# Patient Record
Sex: Female | Born: 1975 | Race: White | Hispanic: No | Marital: Single | Smoking: Former smoker
Health system: Southern US, Community
[De-identification: ages and names within clinical notes are randomized; demographics above are authoritative.]

## PROBLEM LIST (undated history)

## (undated) DIAGNOSIS — R519 Headache, unspecified: Secondary | ICD-10-CM

## (undated) DIAGNOSIS — F4310 Post-traumatic stress disorder, unspecified: Secondary | ICD-10-CM

## (undated) DIAGNOSIS — E785 Hyperlipidemia, unspecified: Secondary | ICD-10-CM

## (undated) DIAGNOSIS — I10 Essential (primary) hypertension: Secondary | ICD-10-CM

## (undated) DIAGNOSIS — F329 Major depressive disorder, single episode, unspecified: Secondary | ICD-10-CM

## (undated) DIAGNOSIS — F419 Anxiety disorder, unspecified: Secondary | ICD-10-CM

## (undated) DIAGNOSIS — G8929 Other chronic pain: Secondary | ICD-10-CM

## (undated) DIAGNOSIS — F32A Depression, unspecified: Secondary | ICD-10-CM

## (undated) HISTORY — DX: Other chronic pain: G89.29

## (undated) HISTORY — DX: Post-traumatic stress disorder, unspecified: F43.10

## (undated) HISTORY — DX: Essential (primary) hypertension: I10

## (undated) HISTORY — DX: Major depressive disorder, single episode, unspecified: F32.9

## (undated) HISTORY — DX: Headache, unspecified: R51.9

## (undated) HISTORY — DX: Depression, unspecified: F32.A

## (undated) HISTORY — DX: Hyperlipidemia, unspecified: E78.5

## (undated) HISTORY — DX: Anxiety disorder, unspecified: F41.9

---

## 2005-05-06 DIAGNOSIS — F4310 Post-traumatic stress disorder, unspecified: Secondary | ICD-10-CM

## 2005-05-06 HISTORY — DX: Post-traumatic stress disorder, unspecified: F43.10

## 2017-02-04 DIAGNOSIS — L259 Unspecified contact dermatitis, unspecified cause: Secondary | ICD-10-CM | POA: Insufficient documentation

## 2017-02-04 DIAGNOSIS — J0100 Acute maxillary sinusitis, unspecified: Secondary | ICD-10-CM | POA: Insufficient documentation

## 2017-08-29 ENCOUNTER — Encounter: Payer: Self-pay | Admitting: Family Medicine

## 2017-08-29 ENCOUNTER — Other Ambulatory Visit: Payer: Self-pay

## 2017-08-29 ENCOUNTER — Ambulatory Visit: Payer: 59 | Admitting: Family Medicine

## 2017-08-29 VITALS — BP 170/100 | HR 104 | Temp 98.9°F | Ht 66.5 in | Wt 240.4 lb

## 2017-08-29 DIAGNOSIS — Z6838 Body mass index (BMI) 38.0-38.9, adult: Secondary | ICD-10-CM

## 2017-08-29 DIAGNOSIS — F4310 Post-traumatic stress disorder, unspecified: Secondary | ICD-10-CM | POA: Diagnosis not present

## 2017-08-29 DIAGNOSIS — I10 Essential (primary) hypertension: Secondary | ICD-10-CM

## 2017-08-29 DIAGNOSIS — J302 Other seasonal allergic rhinitis: Secondary | ICD-10-CM | POA: Diagnosis not present

## 2017-08-29 DIAGNOSIS — F3341 Major depressive disorder, recurrent, in partial remission: Secondary | ICD-10-CM | POA: Diagnosis not present

## 2017-08-29 MED ORDER — DULOXETINE HCL 30 MG PO CPEP: 30.0000 mg | ORAL_CAPSULE | Freq: Every day | ORAL | 5 refills | Status: DC

## 2017-08-29 MED ORDER — LISINOPRIL-HYDROCHLOROTHIAZIDE 20-25 MG PO TABS: 1.0000 | ORAL_TABLET | Freq: Every day | ORAL | 5 refills | Status: DC
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
## 2017-08-29 NOTE — Patient Instructions (Signed)
     IF you received an x-ray today, you will receive an invoice from [facility]. Please contact [facility] at [phone] with questions or concerns regarding your invoice.   IF you received labwork today, you will receive an invoice from [facility]. Please contact [facility] at [phone] with questions or concerns regarding your invoice.   Our billing staff will not be able to assist you with questions regarding bills from these companies.  You will be contacted with the lab results as soon as they are available. The fastest way to get your results is to activate your My Chart account. Instructions are located on the last page of this paperwork. If you have not heard from us regarding the results in 2 weeks, please contact this office.     

## 2017-08-30 LAB — CBC WITH DIFFERENTIAL/PLATELET
Basophils Absolute: 0.1 10*3/uL (ref 0.0–0.2)
Basos: 1 %
EOS (ABSOLUTE): 0.2 10*3/uL (ref 0.0–0.4)
Eos: 2 %
Hematocrit: 39 % (ref 34.0–46.6)
Hemoglobin: 12.7 g/dL (ref 11.1–15.9)
Immature Grans (Abs): 0 10*3/uL (ref 0.0–0.1)
Immature Granulocytes: 0 %
Lymphocytes Absolute: 4.1 10*3/uL — ABNORMAL HIGH (ref 0.7–3.1)
Lymphs: 40 %
MCH: 26.9 pg (ref 26.6–33.0)
MCHC: 32.6 g/dL (ref 31.5–35.7)
MCV: 83 fL (ref 79–97)
Monocytes Absolute: 0.5 10*3/uL (ref 0.1–0.9)
Monocytes: 5 %
Neutrophils Absolute: 5.5 10*3/uL (ref 1.4–7.0)
Neutrophils: 52 %
Platelets: 354 10*3/uL (ref 150–379)
RBC: 4.72 x10E6/uL (ref 3.77–5.28)
RDW: 15.1 % (ref 12.3–15.4)
WBC: 10.3 10*3/uL (ref 3.4–10.8)

## 2017-08-30 LAB — CMP14+EGFR
ALT: 14 IU/L (ref 0–32)
AST: 15 IU/L (ref 0–40)
Albumin/Globulin Ratio: 1.8 (ref 1.2–2.2)
Albumin: 4.6 g/dL (ref 3.5–5.5)
Alkaline Phosphatase: 75 IU/L (ref 39–117)
BUN/Creatinine Ratio: 15 (ref 9–23)
BUN: 13 mg/dL (ref 6–24)
Bilirubin Total: <0.2 mg/dL (ref 0.0–1.2)
CO2: 20 mmol/L (ref 20–29)
Calcium: 9.5 mg/dL (ref 8.7–10.2)
Chloride: 101 mmol/L (ref 96–106)
Creatinine, Ser: 0.89 mg/dL (ref 0.57–1.00)
GFR calc Af Amer: 93 mL/min/{1.73_m2} (ref >59)
GFR calc non Af Amer: 81 mL/min/{1.73_m2} (ref >59)
Globulin, Total: 2.5 g/dL (ref 1.5–4.5)
Glucose: 81 mg/dL (ref 65–99)
Potassium: 4.2 mmol/L (ref 3.5–5.2)
Sodium: 138 mmol/L (ref 134–144)
Total Protein: 7.1 g/dL (ref 6.0–8.5)

## 2017-08-30 LAB — TSH: TSH: 2.29 u[IU]/mL (ref 0.450–4.500)

## 2017-08-30 LAB — LIPID PANEL
Chol/HDL Ratio: 5.2 ratio — ABNORMAL HIGH (ref 0.0–4.4)
Cholesterol, Total: 219 mg/dL — ABNORMAL HIGH (ref 100–199)
HDL: 42 mg/dL (ref >39)
LDL Calculated: 132 mg/dL — ABNORMAL HIGH (ref 0–99)
Triglycerides: 227 mg/dL — ABNORMAL HIGH (ref 0–149)
VLDL Cholesterol Cal: 45 mg/dL — ABNORMAL HIGH (ref 5–40)

## 2017-08-30 LAB — HEMOGLOBIN A1C
Est. average glucose Bld gHb Est-mCnc: 108 mg/dL
Hgb A1c MFr Bld: 5.4 % (ref 4.8–5.6)

## 2017-09-03 DIAGNOSIS — F4310 Post-traumatic stress disorder, unspecified: Secondary | ICD-10-CM | POA: Insufficient documentation

## 2017-09-03 DIAGNOSIS — J302 Other seasonal allergic rhinitis: Secondary | ICD-10-CM | POA: Insufficient documentation

## 2017-09-03 DIAGNOSIS — I10 Essential (primary) hypertension: Secondary | ICD-10-CM | POA: Insufficient documentation

## 2017-09-03 DIAGNOSIS — F418 Other specified anxiety disorders: Secondary | ICD-10-CM | POA: Insufficient documentation

## 2017-09-03 DIAGNOSIS — F3341 Major depressive disorder, recurrent, in partial remission: Secondary | ICD-10-CM | POA: Insufficient documentation

## 2017-09-03 MED ORDER — FLUTICASONE PROPIONATE 50 MCG/ACT NA SUSP: 1.0000 | Freq: Two times a day (BID) | NASAL | 6 refills | Status: DC

## 2017-09-25 ENCOUNTER — Ambulatory Visit (INDEPENDENT_AMBULATORY_CARE_PROVIDER_SITE_OTHER): Payer: 59 | Admitting: Family Medicine

## 2017-09-25 ENCOUNTER — Encounter: Payer: Self-pay | Admitting: Family Medicine

## 2017-09-25 ENCOUNTER — Other Ambulatory Visit: Payer: Self-pay

## 2017-09-25 VITALS — BP 118/82 | HR 93 | Temp 98.8°F | Ht 66.54 in | Wt 239.0 lb

## 2017-09-25 DIAGNOSIS — I10 Essential (primary) hypertension: Secondary | ICD-10-CM

## 2017-09-25 DIAGNOSIS — N920 Excessive and frequent menstruation with regular cycle: Secondary | ICD-10-CM

## 2017-09-25 DIAGNOSIS — Z01419 Encounter for gynecological examination (general) (routine) without abnormal findings: Secondary | ICD-10-CM | POA: Diagnosis not present

## 2017-09-25 DIAGNOSIS — Z23 Encounter for immunization: Secondary | ICD-10-CM

## 2017-09-25 NOTE — Progress Notes (Signed)
5/23/20198:27 AM  Maria Morse 13-Mar-1976, 42 y.o. female [id number]  Chief Complaint  Patient presents with  . Gynecologic Exam    HPI:   Patient is a 42 y.o. female with past medical history significant for PTSD and HTN who presents today for CPE  Cervical Cancer Screening: 2015, normal, denies h/o abnormal. Breast Cancer Screening: n/a, no fhx breast or ovarian cancer Colorectal Cancer Screening: n/a, no fhx colon cancer Bone Density Testing: n/a HIV Screening: 2015 STI Screening: declines Seasonal Influenza Vaccination: will need this season Td/Tdap Vaccination: ordered today Pneumococcal Vaccination: n/a Zoster Vaccination: n/a Frequency of Dental evaluation: needs to establish with new dentist in [place] Frequency of Eye evaluation: yearly, wears eyeglasses  Patient G1P0 Reports regular menses, LMP 09/05/17 Reports menses about 5-7 days, for past 2 years have been getting heavier more painful Sister h/o fibroids, mother h/o "pre-uterine cancer" Not on [facility], not currently sexually active Discussed labs done in April Tolerating new BP med well, denies any side effects  Fall Risk  09/25/2017 08/29/2017  Falls in the past year? No No     Depression screen PHQ 2/9 08/29/2017  Decreased Interest 1  Down, Depressed, Hopeless 1  PHQ - 2 Score 2  Altered sleeping 2  Tired, decreased energy 3  Change in appetite 3  Feeling bad or failure about yourself  2  Trouble concentrating 2  Moving slowly or fidgety/restless 0  Suicidal thoughts 0  PHQ-9 Score 14  Difficult doing work/chores Very difficult    No Known Allergies  Prior to Admission medications   Medication Sig Start Date End Date Taking? Authorizing Provider  cetirizine (ZYRTEC) 10 MG tablet Take 1 tablet by mouth daily.   Yes [provider]  DULoxetine (CYMBALTA) 30 MG capsule Take 1 capsule (30 mg total) by mouth daily. 08/29/17  Yes Myles Lipps, MD  fluticasone (FLONASE) 50 MCG/ACT nasal spray  Place 1 spray into both nostrils 2 (two) times daily. 09/03/17  Yes Myles Lipps, MD  lisinopril-hydrochlorothiazide (PRINZIDE,ZESTORETIC) 20-25 MG tablet Take 1 tablet by mouth daily. 08/29/17  Yes Myles Lipps, MD    Past Medical History:  Diagnosis Date  . Anxiety   . Depression   . Hypertension     History reviewed. No pertinent surgical history.  Social History   Tobacco Use  . Smoking status: Never Smoker  . Smokeless tobacco: Never Used  Substance Use Topics  . Alcohol use: Never    Frequency: Never    Family History  Problem Relation Age of Onset  . Healthy Mother   . Hypertension Father   . Hyperlipidemia Father   . Healthy Sister   . Healthy Brother     Review of Systems  Constitutional: Negative for chills and fever.  Respiratory: Negative for cough and shortness of breath.   Cardiovascular: Negative for chest pain, palpitations and leg swelling.  Gastrointestinal: Negative for abdominal pain, blood in stool, constipation, diarrhea, melena, nausea and vomiting.  Genitourinary: Negative for dysuria and hematuria.       Neg breast lumps or nipple discharge Neg vaginal discharge, pelvic pain  Neurological: Negative for dizziness and headaches.  All other systems reviewed and are negative.    OBJECTIVE:  Blood pressure 118/82, pulse 93, temperature 98.8 F (37.1 C), temperature source Oral, height 5' 6.54" (1.69 m), weight 239 lb (108.4 kg), SpO2 96 %.  Physical Exam  Constitutional: She is oriented to person, place, and time. She appears well-developed and well-nourished.  HENT:  Head: Normocephalic and atraumatic.  Right Ear: Hearing, tympanic membrane, external ear and ear canal normal.  Left Ear: Hearing, tympanic membrane, external ear and ear canal normal.  Mouth/Throat: Oropharynx is clear and moist.  Eyes: Pupils are equal, round, and reactive to light. Conjunctivae and EOM are normal.  Neck: Neck supple. No thyromegaly present.    Cardiovascular: Normal rate, regular rhythm, normal heart sounds and intact distal pulses. Exam reveals no gallop and no friction rub.  No murmur heard. Pulmonary/Chest: Effort normal and breath sounds normal. She has no wheezes. She has no rhonchi. She has no rales. Right breast exhibits no inverted nipple, no mass, no nipple discharge, no skin change and no tenderness. Left breast exhibits no inverted nipple, no mass, no nipple discharge, no skin change and no tenderness. Breasts are symmetrical.  Abdominal: Soft. Bowel sounds are normal. She exhibits no distension. There is no hepatosplenomegaly. There is no tenderness. There is no guarding.  Genitourinary: There is no rash or lesion on the right labia. There is no rash or lesion on the left labia. Uterus is not enlarged, not fixed and not tender. Cervix exhibits no motion tenderness, no discharge and no friability. Right adnexum displays no mass and no tenderness. Left adnexum displays no mass and no tenderness. No erythema in the vagina. No vaginal discharge found.  Musculoskeletal: Normal range of motion. She exhibits no edema.  Lymphadenopathy:    She has no cervical adenopathy.    She has no axillary adenopathy.       Right: No supraclavicular adenopathy present.       Left: No supraclavicular adenopathy present.  Neurological: She is alert and oriented to person, place, and time. She has normal reflexes.  Skin: Skin is warm and dry.  Psychiatric: She has a normal mood and affect.  Nursing note and vitals reviewed.   ASSESSMENT and PLAN  1. Encounter for annual routine gynecological examination  HCM reviewed/discussed. Anticipatory guidance regarding healthy weight, lifestyle and choices given.  - Pap IG w/ reflex to HPV when ASC-U  2. Essential hypertension, benign At goal, cont current meds, recheck BMP in 1 month - Basic Metabolic Panel; Future  3. Menorrhagia with regular cycle Discussed treatment options, [place] to r/o  structural causes, consider endomterial bx given age and nulliparous state. Referring to gyn for further evaluation and management. CBC and TSH in April were normal. - US PELVIC COMPLETE WITH TRANSVAGINAL; Future - Ambulatory referral to Gynecology  4. Need for vaccination - Tdap vaccine greater than or equal to 7yo IM  Return in about 6 months (around 03/28/2018).    Myles Lipps, MD Primary Care at [facility] [address] [place], [place] [address] Ph.  [phone] Fax [phone]

## 2017-09-25 NOTE — Patient Instructions (Addendum)
1. Please come in for labwork (lab visit only) in bout a month  For therapy -- [facility] ([address] [place]) - [phone] [place] [address], [place]) - [place] [phone] [facility] [place] - [phone] [place], [phone] [facility]  - [phone] (do not file insurance) Florentina Jenny [phone] [facility] - [place], [phone] [facility] [place] [facility]    IF you received an x-ray today, you will receive an invoice from [facility]. Please contact [facility] at [phone] with questions or concerns regarding your invoice.   IF you received labwork today, you will receive an invoice from [facility]. Please contact [facility] at [phone] with questions or concerns regarding your invoice.   Our billing staff will not be able to assist you with questions regarding bills from these companies.  You will be contacted with the lab results as soon as they are available. The fastest way to get your results is to activate your My Chart account. Instructions are located on the last page of this paperwork. If you have not heard from [place] regarding the results in 2 weeks, please contact this office.     Preventive Care 40-64 Years, Female Preventive care refers to lifestyle choices and visits with your health care provider that can promote health and wellness. What does preventive care include?  A yearly physical exam. This is also called an annual well check.  Dental exams once or twice a year.  Routine eye exams. Ask your health care provider how often you should have your eyes  checked.  Personal lifestyle choices, including: ? Daily care of your teeth and gums. ? Regular physical activity. ? Eating a healthy diet. ? Avoiding tobacco and drug use. ? Limiting alcohol use. ? Practicing safe sex. ? Taking low-dose aspirin daily starting at age 70. ? Taking vitamin and mineral supplements as recommended by your health care provider. What happens during an annual well check? The services and screenings done by your health care provider during your annual well check will depend on your age, overall health, lifestyle risk factors, and family history of disease. Counseling Your health care provider may ask you questions about your:  Alcohol use.  Tobacco use.  Drug use.  Emotional well-being.  Home and relationship well-being.  Sexual activity.  Eating habits.  Work and work Statistician.  Method of birth control.  Menstrual cycle.  Pregnancy history.  Screening You may have the following tests or measurements:  Height, weight, and BMI.  Blood pressure.  Lipid and cholesterol levels. These may be checked every 5 years, or more frequently if you are over 46 years old.  Skin check.  Lung cancer screening. You may have this screening every year starting at age 27 if you have a 30-pack-year history of smoking and currently smoke or have quit within the past 15 years.  Fecal occult blood test (FOBT) of the stool. You may have this test every year starting at age 42.  Flexible sigmoidoscopy or colonoscopy. You may have a sigmoidoscopy every 5 years or a colonoscopy every 10 years starting at age 2.  Hepatitis C blood test.  Hepatitis B blood test.  Sexually transmitted disease (STD) testing.  Diabetes screening. This is done by checking your blood sugar (glucose)  after you have not eaten for a while (fasting). You may have this done every 1-3 years.  Mammogram. This may be done every 1-2 years. Talk to your health care provider about when  you should start having regular mammograms. This may depend on whether you have a family history of breast cancer.  BRCA-related cancer screening. This may be done if you have a family history of breast, ovarian, tubal, or peritoneal cancers.  Pelvic exam and Pap test. This may be done every 3 years starting at age 51. Starting at age 7, this may be done every 5 years if you have a Pap test in combination with an HPV test.  Bone density scan. This is done to screen for osteoporosis. You may have this scan if you are at high risk for osteoporosis.  Discuss your test results, treatment options, and if necessary, the need for more tests with your health care provider. Vaccines Your health care provider may recommend certain vaccines, such as:  Influenza vaccine. This is recommended every year.  Tetanus, diphtheria, and acellular pertussis (Tdap, Td) vaccine. You may need a Td booster every 10 years.  Varicella vaccine. You may need this if you have not been vaccinated.  Zoster vaccine. You may need this after age 64.  Measles, mumps, and rubella (MMR) vaccine. You may need at least one dose of MMR if you were born in 1957 or later. You may also need a second dose.  Pneumococcal 13-valent conjugate (PCV13) vaccine. You may need this if you have certain conditions and were not previously vaccinated.  Pneumococcal polysaccharide (PPSV23) vaccine. You may need one or two doses if you smoke cigarettes or if you have certain conditions.  Meningococcal vaccine. You may need this if you have certain conditions.  Hepatitis A vaccine. You may need this if you have certain conditions or if you travel or work in places where you may be exposed to hepatitis A.  Hepatitis B vaccine. You may need this if you have certain conditions or if you travel or work in places where you may be exposed to hepatitis B.  Haemophilus influenzae type b (Hib) vaccine. You may need this if you have certain  conditions.  Talk to your health care provider about which screenings and vaccines you need and how often you need them. This information is not intended to replace advice given to you by your health care provider. Make sure you discuss any questions you have with your health care provider. Document Released: 05/19/2015 Document Revised: 01/10/2016 Document Reviewed: 02/21/2015 Elsevier Interactive Patient Education  [employer].

## 2017-10-01 LAB — PAP IG W/ RFLX HPV ASCU: PAP Smear Comment: 0

## 2017-10-02 ENCOUNTER — Encounter: Payer: Self-pay | Admitting: Family Medicine

## 2017-10-08 ENCOUNTER — Ambulatory Visit: Payer: 59 | Admitting: Gynecology

## 2017-10-08 ENCOUNTER — Encounter: Payer: Self-pay | Admitting: Gynecology

## 2017-10-08 VITALS — BP 132/88 | Ht 66.0 in | Wt 240.0 lb

## 2017-10-08 DIAGNOSIS — N924 Excessive bleeding in the premenopausal period: Secondary | ICD-10-CM

## 2017-10-08 DIAGNOSIS — N946 Dysmenorrhea, unspecified: Secondary | ICD-10-CM

## 2017-10-08 NOTE — Patient Instructions (Signed)
Follow up for ultrasound as scheduled 

## 2017-10-08 NOTE — Progress Notes (Signed)
    Elby BeckKathryn Janoski Nov 20, 1975 [id number]        42 y.o. G1 P0010 new patient who presents with a 3-year history of worsening menses.  Patient notes that her periods have progressively gotten heavier and more painful.  She now has to change tampons frequently having bleedthrough episodes and significant cramping and discomfort.  Periods are lasting 6 days occurring monthly with no intermenstrual bleeding.  Currently not sexually active.  Using no contraception.  Family history of leiomyoma and endometriosis.  Recent GYN exam by her primary physician was normal to include a negative Pap smear.  Recent hemoglobin and TSH normal  Past medical history,surgical history, problem list, medications, allergies, family history and social history were all reviewed and documented in the EPIC chart.  Directed ROS with pertinent positives and negatives documented in the history of present illness/assessment and plan.  Exam: Robin assistant Vitals:   10/08/17 0827  BP: 132/88  Weight: 240 lb (108.9 kg)  Height: 5\' 6"  (1.676 m)   General appearance:  Normal Abdomen soft nontender without masses guarding rebound Pelvic external BUS vagina normal.  Cervix normal.  Uterus normal size midline mobile nontender.  Adnexa without masses or tenderness.  Rectal exam is normal.  Assessment/Plan:  42 y.o. G1 P0010 with several year history of increasing dysmenorrhea and menorrhagia.  Does have a family history of leiomyoma and endometriosis.  Pelvic exam is normal.  Currently on no contraception.  Not sexually active.  We discussed the differential to include primary dysmenorrhea/menorrhagia, adenomyosis/endometriosis, leiomyoma/endometrial polyps all reviewed.  Possible treatment options ranging from hormonal manipulation such as oral contraceptives either intermittently or continuously or progesterone only products, Mirena IUD, hysteroscopy if endometrial defects and laparoscopy rule out endometriosis.  Recommended we  start with sonohysterogram for pelvic surveillance rule out cavitary defects, leiomyoma, ovarian pathology.  Patient will schedule in follow-up for this and then we will rediscuss treatment options.  Blood pressure 132/88 reviewed and she relates just starting on an antihypertensive.  Greater than 50% of my time was spent in direct face to face counseling and coordination of care with the patient.     Dara Lordsimothy P Pranavi Aure MD, 9:06 AM 10/08/2017

## 2017-10-13 ENCOUNTER — Other Ambulatory Visit: Payer: Self-pay | Admitting: Gynecology

## 2017-10-13 ENCOUNTER — Other Ambulatory Visit: Payer: Self-pay

## 2017-10-13 DIAGNOSIS — N939 Abnormal uterine and vaginal bleeding, unspecified: Secondary | ICD-10-CM

## 2017-10-23 ENCOUNTER — Other Ambulatory Visit: Payer: 59

## 2017-10-23 ENCOUNTER — Ambulatory Visit: Payer: 59 | Admitting: Gynecology

## 2018-03-06 ENCOUNTER — Other Ambulatory Visit: Payer: Self-pay | Admitting: Family Medicine

## 2018-03-06 NOTE — Telephone Encounter (Signed)
Requested Prescriptions  Pending Prescriptions Disp Refills  . lisinopril-hydrochlorothiazide (PRINZIDE,ZESTORETIC) 20-25 MG tablet [Pharmacy Med Name: LISINOPRIL-HCTZ 20-25 MG TAB] 90 tablet 0    Sig: TAKE ONE TABLET BY MOUTH DAILY     Cardiovascular:  ACEI + Diuretic Combos Failed - 03/06/2018  6:20 AM      Failed - Na in normal range and within 180 days    Sodium  Date Value Ref Range Status  08/29/2017 138 134 - 144 mmol/L Final         Failed - K in normal range and within 180 days    Potassium  Date Value Ref Range Status  08/29/2017 4.2 3.5 - 5.2 mmol/L Final         Failed - Cr in normal range and within 180 days    Creatinine, Ser  Date Value Ref Range Status  08/29/2017 0.89 0.57 - 1.00 mg/dL Final         Failed - Ca in normal range and within 180 days    Calcium  Date Value Ref Range Status  08/29/2017 9.5 8.7 - 10.2 mg/dL Final         Passed - Patient is not pregnant      Passed - Last BP in normal range    BP Readings from Last 1 Encounters:  10/08/17 132/88         Passed - Valid encounter within last 6 months    Recent Outpatient Visits          5 months ago Encounter for annual routine gynecological examination   [facility], Meda Coffee, MD   6 months ago Essential hypertension, benign   [facility], Meda Coffee, MD

## 2018-03-11 ENCOUNTER — Ambulatory Visit (INDEPENDENT_AMBULATORY_CARE_PROVIDER_SITE_OTHER): Payer: 59 | Admitting: Physician Assistant

## 2018-03-11 DIAGNOSIS — I10 Essential (primary) hypertension: Secondary | ICD-10-CM

## 2018-03-12 LAB — BASIC METABOLIC PANEL
BUN/Creatinine Ratio: 14 (ref 9–23)
BUN: 13 mg/dL (ref 6–24)
CO2: 23 mmol/L (ref 20–29)
Calcium: 9.7 mg/dL (ref 8.7–10.2)
Chloride: 99 mmol/L (ref 96–106)
Creatinine, Ser: 0.95 mg/dL (ref 0.57–1.00)
GFR calc Af Amer: 86 mL/min/{1.73_m2} (ref >59)
GFR calc non Af Amer: 75 mL/min/{1.73_m2} (ref >59)
Glucose: 84 mg/dL (ref 65–99)
Potassium: 4.1 mmol/L (ref 3.5–5.2)
Sodium: 138 mmol/L (ref 134–144)

## 2018-03-12 NOTE — Progress Notes (Signed)
FT only. I did not see this pt.

## 2018-03-15 ENCOUNTER — Other Ambulatory Visit: Payer: Self-pay | Admitting: Family Medicine

## 2018-03-24 ENCOUNTER — Ambulatory Visit: Payer: 59 | Admitting: Family Medicine

## 2018-03-24 ENCOUNTER — Encounter: Payer: Self-pay | Admitting: Family Medicine

## 2018-03-24 ENCOUNTER — Other Ambulatory Visit: Payer: Self-pay

## 2018-03-24 VITALS — BP 112/77 | HR 83 | Temp 97.7°F | Ht 66.0 in | Wt 236.8 lb

## 2018-03-24 DIAGNOSIS — Z23 Encounter for immunization: Secondary | ICD-10-CM

## 2018-03-24 DIAGNOSIS — F4310 Post-traumatic stress disorder, unspecified: Secondary | ICD-10-CM | POA: Diagnosis not present

## 2018-03-24 DIAGNOSIS — R05 Cough: Secondary | ICD-10-CM

## 2018-03-24 DIAGNOSIS — I10 Essential (primary) hypertension: Secondary | ICD-10-CM | POA: Diagnosis not present

## 2018-03-24 DIAGNOSIS — R059 Cough, unspecified: Secondary | ICD-10-CM

## 2018-03-24 MED ORDER — HYDROCHLOROTHIAZIDE 25 MG PO TABS: 25.0000 mg | ORAL_TABLET | Freq: Every day | ORAL | 1 refills | Status: DC

## 2018-03-24 MED ORDER — LOSARTAN POTASSIUM 50 MG PO TABS: 50.0000 mg | ORAL_TABLET | Freq: Every day | ORAL | 1 refills | Status: DC

## 2018-03-24 NOTE — Progress Notes (Signed)
11/19/20199:06 AM  Maria BeckKathryn Morse Sep 23, 1975, 42 y.o. female [id number]  Chief Complaint  Patient presents with  . Follow-up    6 month follow and discussion of lab results  . Medication Refill    duloxetine and lisinopril    HPI:   Patient is a 42 y.o. female with past medical history significant for PTSD and HTN  who presents today for routine followup  Overall tolerating BP meds She however has been having a daily cough since started Has been doing online nutrition program Eating more meals at home, drinking more water  Continues to have irritability and hypervigilence Less Intrusive thoughts and nightmares Still looking into counseling Tries to manage with meditation Of recent waking up more often in middle of the night Usually able to go back to sleep Uses weighted blanket Has goof support system  Saw gyn, wanted to do sonohysto Cant afford Menses getting liighter  Lab Results  Component Value Date   CREATININE 0.95 03/11/2018   Lab Results  Component Value Date   NA 138 03/11/2018   K 4.1 03/11/2018   CL 99 03/11/2018   CO2 23 03/11/2018    Fall Risk  03/24/2018 09/25/2017 08/29/2017  Falls in the past year? 0 No No     [facility] screen [facility] 2/9 03/24/2018 08/29/2017  Decreased Interest 0 1  Down, Depressed, Hopeless 0 1  PHQ - 2 Score 0 2  Altered sleeping 1 2  Tired, decreased energy 1 3  Change in appetite 1 3  Feeling bad or failure about yourself  0 2  Trouble concentrating 1 2  Moving slowly or fidgety/restless 1 0  Suicidal thoughts 0 0  PHQ-9 Score 5 14  Difficult doing work/chores Somewhat difficult Very difficult   GAD 7 : Generalized Anxiety Score 03/24/2018 08/29/2017  Nervous, Anxious, on Edge 1 2  Control/stop worrying 1 2  Worry too much - different things 1 3  Trouble relaxing 1 3  Restless 0 0  Easily annoyed or irritable 1 2  Afraid - awful might happen 1 2  Total GAD 7 Score 6 14  Anxiety Difficulty Somewhat difficult  Very difficult     No Known Allergies  Prior to Admission medications   Medication Sig Start Date End Date Taking? Authorizing Provider  cetirizine (ZYRTEC) 10 MG tablet Take 1 tablet by mouth daily.   Yes [provider]  DULoxetine (CYMBALTA) 30 MG capsule TAKE ONE CAPSULE BY MOUTH DAILY 03/16/18  Yes Maria LippsSantiago, Maria Morse M, MD  fluticasone [facility](FLONASE) 50 MCG/ACT nasal spray Place 1 spray into both nostrils 2 (two) times daily. 09/03/17  Yes Maria LippsSantiago, Maria Morse M, MD  lisinopril-hydrochlorothiazide (PRINZIDE,ZESTORETIC) 20-25 MG tablet TAKE ONE TABLET BY MOUTH DAILY 03/06/18  Yes Maria LippsSantiago, Wisdom Seybold M, MD    Past Medical History:  Diagnosis Date  . Anxiety   . Depression   . Hypertension   . PTSD (post-traumatic stress disorder) 2007    History reviewed. No pertinent surgical history.  Social History   Tobacco Use  . Smoking status: Never Smoker  . Smokeless tobacco: Never Used  Substance Use Topics  . Alcohol use: Never    Frequency: Never    Family History  Problem Relation Age of Onset  . Healthy Mother   . Hypertension Father   . Hyperlipidemia Father   . Healthy Sister   . Healthy Brother     Review of Systems  Constitutional: Negative for chills and fever.  HENT: Positive for congestion.   Respiratory:  Positive for cough. Negative for shortness of breath.   Cardiovascular: Negative for chest pain, palpitations and leg swelling.  Gastrointestinal: Negative for abdominal pain, nausea and vomiting.  Psychiatric/Behavioral: Negative for depression and suicidal ideas. The patient is nervous/anxious and has insomnia.      OBJECTIVE:  Blood pressure 112/77, pulse 83, temperature 97.7 F (36.5 C), temperature source Oral, height 5\' 6"  (1.676 m), weight 236 lb 12.8 oz (107.4 kg), last menstrual period 03/23/2018, SpO2 97 %. Body mass index is 38.22 kg/m.   Wt Readings from Last 3 Encounters:  03/24/18 236 lb 12.8 oz (107.4 kg)  10/08/17 240 lb (108.9 kg)  09/25/17  239 lb (108.4 kg)    Physical Exam  Constitutional: She is oriented to person, place, and time. She appears well-developed and well-nourished.  HENT:  Head: Normocephalic and atraumatic.  Mouth/Throat: Oropharynx is clear and moist. No oropharyngeal exudate.  Eyes: Pupils are equal, round, and reactive to light. Conjunctivae and EOM are normal. No scleral icterus.  Neck: Neck supple.  Cardiovascular: Normal rate, regular rhythm and normal heart sounds. Exam reveals no gallop and no friction rub.  No murmur heard. Pulmonary/Chest: Effort normal and breath sounds normal. She has no wheezes. She has no rales.  Musculoskeletal: She exhibits no edema.  Neurological: She is alert and oriented to person, place, and time.  Skin: Skin is warm and dry.  Psychiatric: She has a normal mood and affect.  Nursing note and vitals reviewed.   ASSESSMENT and PLAN  1. Essential hypertension, benign Controlled. However concern for ACE cough. Changing to ARB. Discussed home BP monitoring. Cont with LFM  2. PTSD (post-traumatic stress disorder) Controlled. Continue current regime.   3. Cough Change ace to arb and re-assess. Consider PND as cause  4. Need for prophylactic vaccination and inoculation against influenza - Flu Vaccine QUAD 36+ mos IM  Other orders - losartan (COZAAR) 50 MG tablet; Take 1 tablet (50 mg total) by mouth daily. - hydrochlorothiazide (HYDRODIURIL) 25 MG tablet; Take 1 tablet (25 mg total) by mouth daily.    Return in about 3 months (around 06/24/2018) for chronic medical conditions.    Maria Lipps, MD Primary Care at [facility] [address] [place], [place] [address] Ph.  [phone] Fax [phone]

## 2018-03-24 NOTE — Patient Instructions (Signed)
° ° ° °  If you have lab work done today you will be contacted with your lab results within the next 2 weeks.  If you have not heard from us then please contact us. The fastest way to get your results is to register for My Chart. ° ° °IF you received an x-ray today, you will receive an invoice from [facility]. Please contact [facility] at [phone] with questions or concerns regarding your invoice.  ° °IF you received labwork today, you will receive an invoice from [facility]. Please contact [facility] at [phone] with questions or concerns regarding your invoice.  ° °Our billing staff will not be able to assist you with questions regarding bills from these companies. ° °You will be contacted with the lab results as soon as they are available. The fastest way to get your results is to activate your My Chart account. Instructions are located on the last page of this paperwork. If you have not heard from us regarding the results in 2 weeks, please contact this office. °  ° ° ° °

## 2018-04-25 DIAGNOSIS — J019 Acute sinusitis, unspecified: Secondary | ICD-10-CM | POA: Diagnosis not present

## 2018-05-10 ENCOUNTER — Encounter: Payer: Self-pay | Admitting: Family Medicine

## 2018-05-13 MED ORDER — DULOXETINE HCL 60 MG PO CPEP: 60.0000 mg | ORAL_CAPSULE | Freq: Every day | ORAL | 0 refills | Status: DC

## 2018-06-24 ENCOUNTER — Encounter: Payer: Self-pay | Admitting: Family Medicine

## 2018-06-24 ENCOUNTER — Ambulatory Visit: Payer: 59 | Admitting: Family Medicine

## 2018-06-24 ENCOUNTER — Other Ambulatory Visit: Payer: Self-pay

## 2018-06-24 VITALS — BP 131/84 | HR 85 | Temp 98.9°F | Ht 66.0 in | Wt 241.6 lb

## 2018-06-24 DIAGNOSIS — E559 Vitamin D deficiency, unspecified: Secondary | ICD-10-CM | POA: Insufficient documentation

## 2018-06-24 DIAGNOSIS — R5382 Chronic fatigue, unspecified: Secondary | ICD-10-CM | POA: Insufficient documentation

## 2018-06-24 DIAGNOSIS — G9332 Myalgic encephalomyelitis/chronic fatigue syndrome: Secondary | ICD-10-CM | POA: Insufficient documentation

## 2018-06-24 DIAGNOSIS — F4310 Post-traumatic stress disorder, unspecified: Secondary | ICD-10-CM

## 2018-06-24 DIAGNOSIS — R030 Elevated blood-pressure reading, without diagnosis of hypertension: Secondary | ICD-10-CM | POA: Insufficient documentation

## 2018-06-24 DIAGNOSIS — Z6838 Body mass index (BMI) 38.0-38.9, adult: Secondary | ICD-10-CM | POA: Diagnosis not present

## 2018-06-24 DIAGNOSIS — I10 Essential (primary) hypertension: Secondary | ICD-10-CM

## 2018-06-24 DIAGNOSIS — R635 Abnormal weight gain: Secondary | ICD-10-CM | POA: Insufficient documentation

## 2018-06-24 DIAGNOSIS — N920 Excessive and frequent menstruation with regular cycle: Secondary | ICD-10-CM | POA: Insufficient documentation

## 2018-06-24 MED ORDER — LOSARTAN POTASSIUM 50 MG PO TABS: 50.0000 mg | ORAL_TABLET | Freq: Every day | ORAL | 1 refills | Status: DC

## 2018-06-24 MED ORDER — DULOXETINE HCL 60 MG PO CPEP: 60.0000 mg | ORAL_CAPSULE | Freq: Every day | ORAL | 0 refills | Status: DC

## 2018-06-24 MED ORDER — HYDROCHLOROTHIAZIDE 25 MG PO TABS: 25.0000 mg | ORAL_TABLET | Freq: Every day | ORAL | 1 refills | Status: DC

## 2018-06-24 NOTE — Progress Notes (Signed)
2/19/20209:03 AM  Maria Morse Apr 10, 1976, 43 y.o. female [id number]  Chief Complaint  Patient presents with  . Hypertension  . Anxiety  . Depression    HPI:   Patient is a 43 y.o. female with past medical history significant for HTN, chronic fatigue, PTSD, vitamin D deficiency presents today for routine followup  Last OV Nov 2019 Changed to ARB due to cough Cough has resolved, tolerating losartan well Was having issues originally with supply issues but these have resolved  Dose increased at last OV to '60mg'$  duloxetine Mood is still not much improved Having increased stressors of recent Having more triggers re PTSD, mostly daytime symptoms  Her efforts to eat healthier and exercise have fallen to the wayside Has not been able to meal prepping She would like to restart once things settle down a bit  Fall Risk  06/24/2018 03/24/2018 09/25/2017 08/29/2017  Falls in the past year? 0 0 No No  Number falls in past yr: 0 - - -  Injury with Fall? 0 - - -    GAD 7 : Generalized Anxiety Score 06/24/2018 03/24/2018 08/29/2017  Nervous, Anxious, on Edge '2 1 2  '$ Control/stop worrying '2 1 2  '$ Worry too much - different things '2 1 3  '$ Trouble relaxing '1 1 3  '$ Restless 0 0 0  Easily annoyed or irritable '2 1 2  '$ Afraid - awful might happen '2 1 2  '$ Total GAD 7 Score '11 6 14  '$ Anxiety Difficulty Very difficult Somewhat difficult Very difficult     Depression screen [facility] 2/9 06/24/2018 03/24/2018 08/29/2017  Decreased Interest 1 0 1  Down, Depressed, Hopeless 2 0 1  PHQ - 2 Score 3 0 2  Altered sleeping '2 1 2  '$ Tired, decreased energy '2 1 3  '$ Change in appetite '2 1 3  '$ Feeling bad or failure about yourself  1 0 2  Trouble concentrating '1 1 2  '$ Moving slowly or fidgety/restless 0 1 0  Suicidal thoughts 0 0 0  PHQ-9 Score '11 5 14  '$ Difficult doing work/chores Very difficult Somewhat difficult Very difficult    No Known Allergies  Prior to Admission medications   Medication Sig Start Date  End Date Taking? Authorizing Provider  cetirizine (ZYRTEC) 10 MG tablet Take 1 tablet by mouth daily.   Yes [provider]  DULoxetine (CYMBALTA) 60 MG capsule Take 1 capsule (60 mg total) by mouth daily. 05/13/18  Yes Rutherford Guys, MD  fluticasone (FLONASE) 50 MCG/ACT nasal spray Place 1 spray into both nostrils 2 (two) times daily. 09/03/17  Yes Rutherford Guys, MD  hydrochlorothiazide (HYDRODIURIL) 25 MG tablet Take 1 tablet (25 mg total) by mouth daily. 03/24/18  Yes Rutherford Guys, MD  losartan (COZAAR) 50 MG tablet Take 1 tablet (50 mg total) by mouth daily. 03/24/18  Yes Rutherford Guys, MD    Past Medical History:  Diagnosis Date  . Anxiety   . Depression   . Hypertension   . PTSD (post-traumatic stress disorder) 2007    No past surgical history on file.  Social History   Tobacco Use  . Smoking status: Never Smoker  . Smokeless tobacco: Never Used  Substance Use Topics  . Alcohol use: Never    Frequency: Never    Family History  Problem Relation Age of Onset  . Healthy Mother   . Hypertension Father   . Hyperlipidemia Father   . Healthy Sister   . Healthy Brother  ROS Per hpi  OBJECTIVE:  Blood pressure 131/84, pulse 85, temperature 98.9 F (37.2 C), temperature source Oral, height '5\' 6"'$  (1.676 m), weight 241 lb 9.6 oz (109.6 kg), last menstrual period 05/25/2018, SpO2 96 %. Body mass index is 39 kg/m.   Wt Readings from Last 3 Encounters:  06/24/18 241 lb 9.6 oz (109.6 kg)  03/24/18 236 lb 12.8 oz (107.4 kg)  10/08/17 240 lb (108.9 kg)    Physical Exam Vitals signs and nursing note reviewed.  Constitutional:      Appearance: She is well-developed.  HENT:     Head: Normocephalic and atraumatic.  Eyes:     General: No scleral icterus.    Conjunctiva/sclera: Conjunctivae normal.     Pupils: Pupils are equal, round, and reactive to light.  Neck:     Musculoskeletal: Neck supple.  Pulmonary:     Effort: Pulmonary effort is normal.   Skin:    General: Skin is warm and dry.  Neurological:     Mental Status: She is alert and oriented to person, place, and time.      ASSESSMENT and PLAN  1. Essential hypertension, benign Controlled. Continue current regime.  - Lipid panel - CMP14+EGFR - TSH  2. PTSD (post-traumatic stress disorder) Not well controlled. Referring to counseling. discussed possible use of prazosin if ERMD not recommended by therapy.  - Ambulatory referral to Psychology  3. BMI 38.0-38.9,adult Discussed LFM, discussed importance of self-care as validating of worth and agency.   Other orders - hydrochlorothiazide (HYDRODIURIL) 25 MG tablet; Take 1 tablet (25 mg total) by mouth daily. - losartan (COZAAR) 50 MG tablet; Take 1 tablet (50 mg total) by mouth daily. - DULoxetine (CYMBALTA) 60 MG capsule; Take 1 capsule (60 mg total) by mouth daily.    Return in about 6 weeks (around 08/05/2018).    Rutherford Guys, MD Primary Care at [place], [place] [address] Ph.  [phone] Fax [phone]

## 2018-06-24 NOTE — Patient Instructions (Addendum)
For therapy -- 1. [facility] (Beth Kincaid, Ernest McCoy,Heather Kitchens, Karla Townsend) - [phone]  2.  [facility] (Julie Whitt) - [phone] 3. [facility] - [phone] 4. [facility] - [phone] (do not file insurance) 5. [facility] - accepts most major insurances. [phone] or email [email or web address]    If you have lab work done today you will be contacted with your lab results within the next 2 weeks.  If you have not heard from us then please contact us. The fastest way to get your results is to register for My Chart.   IF you received an x-ray today, you will receive an invoice from [facility]. Please contact  Radiology at [phone] with questions or concerns regarding your invoice.   IF you received labwork today, you will receive an invoice from [facility]. Please contact [facility] at [phone] with questions or concerns regarding your invoice.   Our billing staff will not be able to assist you with questions regarding bills from these companies.  You will be contacted with the lab results as soon as they are available. The fastest way to get your results is to activate your My Chart account. Instructions are located on the last page of this paperwork. If you have not heard from us regarding the results in 2 weeks, please contact this office.     

## 2018-06-25 LAB — LIPID PANEL
Chol/HDL Ratio: 4.8 ratio — ABNORMAL HIGH (ref 0.0–4.4)
Cholesterol, Total: 193 mg/dL (ref 100–199)
HDL: 40 mg/dL (ref >39)
LDL Calculated: 125 mg/dL — ABNORMAL HIGH (ref 0–99)
Triglycerides: 139 mg/dL (ref 0–149)
VLDL Cholesterol Cal: 28 mg/dL (ref 5–40)

## 2018-06-25 LAB — CMP14+EGFR
ALT: 16 IU/L (ref 0–32)
AST: 13 IU/L (ref 0–40)
Albumin/Globulin Ratio: 2 (ref 1.2–2.2)
Albumin: 4.4 g/dL (ref 3.8–4.8)
Alkaline Phosphatase: 67 IU/L (ref 39–117)
BUN/Creatinine Ratio: 14 (ref 9–23)
BUN: 13 mg/dL (ref 6–24)
Bilirubin Total: 0.4 mg/dL (ref 0.0–1.2)
CO2: 19 mmol/L — ABNORMAL LOW (ref 20–29)
Calcium: 9 mg/dL (ref 8.7–10.2)
Chloride: 104 mmol/L (ref 96–106)
Creatinine, Ser: 0.94 mg/dL (ref 0.57–1.00)
GFR calc Af Amer: 87 mL/min/{1.73_m2} (ref >59)
GFR calc non Af Amer: 75 mL/min/{1.73_m2} (ref >59)
Globulin, Total: 2.2 g/dL (ref 1.5–4.5)
Glucose: 91 mg/dL (ref 65–99)
Potassium: 4.1 mmol/L (ref 3.5–5.2)
Sodium: 140 mmol/L (ref 134–144)
Total Protein: 6.6 g/dL (ref 6.0–8.5)

## 2018-06-25 LAB — TSH: TSH: 3.51 u[IU]/mL (ref 0.450–4.500)

## 2018-07-14 ENCOUNTER — Ambulatory Visit: Payer: 59 | Admitting: Psychology

## 2018-07-14 DIAGNOSIS — F4322 Adjustment disorder with anxiety: Secondary | ICD-10-CM | POA: Diagnosis not present

## 2018-07-14 DIAGNOSIS — F4310 Post-traumatic stress disorder, unspecified: Secondary | ICD-10-CM | POA: Diagnosis not present

## 2018-07-14 DIAGNOSIS — F330 Major depressive disorder, recurrent, mild: Secondary | ICD-10-CM | POA: Diagnosis not present

## 2018-07-21 ENCOUNTER — Other Ambulatory Visit: Payer: Self-pay

## 2018-07-21 ENCOUNTER — Emergency Department (HOSPITAL_COMMUNITY)
Admission: EM | Admit: 2018-07-21 | Discharge: 2018-07-21 | Disposition: A | Discharge status: Home / Self Care | Payer: 59 | Attending: Emergency Medicine | Admitting: Emergency Medicine

## 2018-07-21 ENCOUNTER — Ambulatory Visit: Payer: Self-pay

## 2018-07-21 ENCOUNTER — Encounter (HOSPITAL_COMMUNITY): Payer: Self-pay | Admitting: Emergency Medicine

## 2018-07-21 ENCOUNTER — Emergency Department (HOSPITAL_COMMUNITY): Payer: 59

## 2018-07-21 DIAGNOSIS — I10 Essential (primary) hypertension: Secondary | ICD-10-CM | POA: Diagnosis not present

## 2018-07-21 DIAGNOSIS — R0602 Shortness of breath: Secondary | ICD-10-CM | POA: Diagnosis not present

## 2018-07-21 DIAGNOSIS — B349 Viral infection, unspecified: Secondary | ICD-10-CM | POA: Diagnosis not present

## 2018-07-21 DIAGNOSIS — M791 Myalgia, unspecified site: Secondary | ICD-10-CM | POA: Diagnosis present

## 2018-07-21 DIAGNOSIS — Z79899 Other long term (current) drug therapy: Secondary | ICD-10-CM | POA: Insufficient documentation

## 2018-07-21 DIAGNOSIS — R0989 Other specified symptoms and signs involving the circulatory and respiratory systems: Secondary | ICD-10-CM | POA: Diagnosis not present

## 2018-07-21 LAB — INFLUENZA PANEL BY PCR (TYPE A & B)
Influenza A By PCR: NEGATIVE
Influenza B By PCR: NEGATIVE

## 2018-07-21 NOTE — ED Notes (Signed)
While attempting to enter the room to discharge the patient, patient noted to be on the phone with her PCP discussing a "second opinion" requesting coronavirus testing. Patient would not get off of the telephone after this RN verbalized to her that she was being discharged and appropriate follow up instructions needed to be addressed. Patient would not get off the phone with PCP for this writer to provide discharge instructions. Charge nurse Stacy RN called to bedside. Discharge paperwork reviewed with patient, patient refused to sign paperwork and patient still in room at this time.

## 2018-07-21 NOTE — ED Provider Notes (Signed)
[facility] Provider Note   CSN: [id number] Arrival date & time: 07/21/18  1318    History   Chief Complaint Chief Complaint  Patient presents with  . flu like sympotms  . Generalized Body Aches    HPI Maria Morse is a 43 y.o. female.     HPI  43 year old female comes in with chief complaint of flulike symptoms and body aches.  Patient is concerned that she might have contracted coronavirus.  Patient has history of anxiety, hypertension.  She reports that last week she was at the [facility] tournament and was exposed to people from all around the country.  Yesterday she started developing flulike symptoms with myalgias, dry cough, shortness of breath and low-grade temperature.  Patient did get a flu shot earlier this year.  Patient denies any recent travel history to an area of outbreak.  She also denies directly being exposed to someone with known covid-19. Pt has no hx of PE, DVT and denies any exogenous hormone (testosterone / estrogen) use, long distance travels or surgery in the past 6 weeks, active cancer, recent immobilization.   Past Medical History:  Diagnosis Date  . Anxiety   . Depression   . Hypertension   . PTSD (post-traumatic stress disorder) 2007    Patient Active Problem List   Diagnosis Date Noted  . Chronic fatigue syndrome 06/24/2018  . Elevated blood-pressure reading without diagnosis of hypertension 06/24/2018  . Menorrhagia 06/24/2018  . Vitamin D deficiency 06/24/2018  . Weight gain 06/24/2018  . Need for vaccination 09/25/2017  . Essential hypertension, benign 09/03/2017  . PTSD (post-traumatic stress disorder) 09/03/2017  . Recurrent major depressive disorder, in partial remission (HCC) 09/03/2017  . Seasonal allergies 09/03/2017  . Acute non-recurrent maxillary sinusitis 02/04/2017  . Contact dermatitis 02/04/2017    History reviewed. No pertinent surgical history.   OB History   No obstetric history on  file.      Home Medications    Prior to Admission medications   Medication Sig Start Date End Date Taking? Authorizing Provider  cetirizine (ZYRTEC) 10 MG tablet Take 10 mg by mouth daily.    Yes [provider]  DULoxetine (CYMBALTA) 60 MG capsule Take 1 capsule (60 mg total) by mouth daily. 06/24/18  Yes Myles Lipps, MD  fluticasone [facility]) 50 MCG/ACT nasal spray Place 1 spray into both nostrils 2 (two) times daily. Patient taking differently: Place 1 spray into both nostrils 2 (two) times daily as needed for allergies.  09/03/17  Yes Myles Lipps, MD  hydrochlorothiazide (HYDRODIURIL) 25 MG tablet Take 1 tablet (25 mg total) by mouth daily. 06/24/18  Yes Myles Lipps, MD  losartan (COZAAR) 50 MG tablet Take 1 tablet (50 mg total) by mouth daily. 06/24/18  Yes Myles Lipps, MD    Family History Family History  Problem Relation Age of Onset  . Healthy Mother   . Hypertension Father   . Hyperlipidemia Father   . Healthy Sister   . Healthy Brother     Social History Social History   Tobacco Use  . Smoking status: Never Smoker  . Smokeless tobacco: Never Used  Substance Use Topics  . Alcohol use: Never    Frequency: Never  . Drug use: Never     Allergies   Patient has no known allergies.   Review of Systems Review of Systems  Constitutional: Positive for activity change.  Respiratory: Positive for cough and shortness of breath.   Skin:  Negative for rash.  Allergic/Immunologic: Negative for immunocompromised state.  All other systems reviewed and are negative.    Physical Exam Updated Vital Signs BP (!) 161/105   Pulse 96   Temp 99.1 F (37.3 C) (Oral)   Resp 16   Ht 5\' 6"  (1.676 m)   Wt 108.9 kg   SpO2 97%   BMI 38.74 kg/m   Physical Exam Vitals signs and nursing note reviewed.  Constitutional:      Appearance: She is well-developed.  HENT:     Head: Normocephalic and atraumatic.  Eyes:     Pupils: Pupils are equal, round,  and reactive to light.  Neck:     Musculoskeletal: Neck supple.  Cardiovascular:     Rate and Rhythm: Normal rate and regular rhythm.     Heart sounds: Normal heart sounds. No murmur.  Pulmonary:     Effort: Pulmonary effort is normal. No respiratory distress.     Breath sounds: No wheezing or rhonchi.  Abdominal:     Palpations: Abdomen is soft.  Skin:    General: Skin is warm and dry.  Neurological:     Mental Status: She is alert and oriented to person, place, and time.      ED Treatments / Results  Labs (all labs ordered are listed, but only abnormal results are displayed) Labs Reviewed  INFLUENZA PANEL BY PCR (TYPE A & B)    EKG None  Radiology Dg Chest Port 1 View  Result Date: 07/21/2018 CLINICAL DATA:  Flu-like symptoms. EXAM: PORTABLE CHEST 1 VIEW COMPARISON:  No recent prior. FINDINGS: Mediastinum and hilar structures normal. Lungs are clear. No focal infiltrate. No pleural effusion or pneumothorax. Heart size normal. No acute bony abnormality. IMPRESSION: No acute cardiopulmonary disease. Electronically Signed   By: Maisie Fus  Register   On: 07/21/2018 14:54    Procedures Procedures (including critical care time)  Medications Ordered in ED Medications - No data to display   Initial Impression / Assessment and Plan / ED Course  I have reviewed the triage vital signs and the nursing notes.  Pertinent labs & imaging results that were available during my care of the patient were reviewed by me and considered in my medical decision making (see chart for details).        43 year old female comes in with chief complaint of flulike illness and concerned that she might have contracted coronavirus.  She does not have any underlying lung disease, cardiac disease, diabetes and she is not immunosuppressed or immunocompromised.   Patient is having some URI-like symptoms and although she does not have any direct exposure to confirmed coed patient or travels to high  risk area, she does indicate that she was at the [facility] tournament where there were people from all over the country.  At this time patient does not meet criteria for call with testing based on institutional protocol at [facility] or [facility] health department & services.  Patient is a little anxious about her diagnosis.  She had a flu shot, but is asking for a flu test which was ordered and is negative.  Chest x-ray is normal.  Results from the ER work-up discussed with the patients along with strict ER return precautions.  We spent fair amount of time explaining the patient why she was not being tested.  It appears that prior to her discharge there was some miscommunication between the bedside nurse and her and she is upset about that.  She has already spoken with  charge nurse and is requesting discussion with the patient representative.  I tried to diffuse the situation and explained to her that there was a stroke patient that was coming in that the nurse had to see and therefore she did not have time to wait, and that the nurse was not aware of the fact that she was speaking with me over the phone.  Patient is still upset about it and I have advised her to follow-up with patient grievance if needed.  Otherwise she is appreciated of help she has received and will consider following up with: ED visit if she is not getting better.  She will return to the ER if she is getting worse.  Final Clinical Impressions(s) / ED Diagnoses   Final diagnoses:  Acute viral syndrome    ED Discharge Orders    None       Derwood Kaplan, MD 07/21/18 1620

## 2018-07-21 NOTE — Discharge Instructions (Addendum)
We saw in the ER for what appears like a viral infection.  Chest x-ray and influenza screen are both negative. As discussed, at this time we cannot test you for code based on hospital protocol.  We recommend that you self quarantine for the time being until your symptoms improve or for 14 days.  Return to the ER immediately if you start having worsening of shortness of breath or high fevers.

## 2018-07-21 NOTE — ED Triage Notes (Signed)
Per pt, flu like symptoms-started yesterday-states SOB and fever, 99.8, this am-patient has not traveled out of the country-states she is anxious "because she knows there are a lot of people that have not been tested so she is afraid" states cough, body aches and SOB, possibly from anxiety

## 2018-07-21 NOTE — ED Notes (Signed)
I was ask to see about the patient being discharged and have the patient leave by Maria Morse. RN. Reviewed discharge instructions with patient. While reviewing, Dr. Rhunette Croft came in to give her a work note and precautions on the COV-19. Patient requested her temperature being taken due to feeling warm. Patients temperature was 99.0 oral. Patient signed for her discharge instruction, removed all lead stickers, and attachments off of her.

## 2018-07-21 NOTE — Telephone Encounter (Signed)
Pt called to say that she is having symptoms of dry cough, chills and sweats with temperature today of 99.6.  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 Pt was reassured that their has been no known exposure to COVID-19. From this tournament.  t states that she feels SOB at rest and that she has chest tightness and dull chest pain. Per protocol and at pt request office was contacted. Per Carolanne Grumbling pt should go to ER with symptoms. Pt was read care advice. Pt verbalized understanding of all instructions.  Reason for Disposition . [1] Difficulty breathing AND [2] not severe AND [3] not from stuffy nose (e.g., not relieved by cleaning out the nose)  Answer Assessment - Initial Assessment Questions 1. WORST SYMPTOM: "What is your worst symptom?" (e.g., cough, runny nose, muscle aches, headache, sore throat, fever)      Cough fever fatigue muscle aches 2. ONSET: "When did your flu symptoms start?"      yesterday 3. COUGH: "How bad is the cough?"       Dry continuous 4. RESPIRATORY DISTRESS: "Describe your breathing."      SOB  5. FEVER: "Do you have a fever?" If so, ask: "What is your temperature, how was it measured, and when did it start?"    Chills last night with sweats  Temp tis AM 99.6 6. EXPOSURE: "Were you exposed to someone with influenza?"      No unaware 7. FLU VACCINE: "Did you get a flu shot this year?"    yes 8. HIGH RISK DISEASE: "Do you any chronic medical problems?" (e.g., heart or lung disease, asthma, weak immune system, or other HIGH RISK conditions)     htn Epstein bare  9. PREGNANCY: "Is there any chance you are pregnant?" "When was your last menstrual period?"     No Feb 20th 10. OTHER SYMPTOMS: "Do you have any other symptoms?"  (e.g., runny nose, muscle aches, headache, sore throat)     none  Protocols used: INFLUENZA - SEASONAL-A-AH

## 2018-07-21 NOTE — Telephone Encounter (Signed)
FYI: Due to pt symptoms pt was instructed to go to ER

## 2018-07-22 ENCOUNTER — Telehealth: Payer: 59 | Admitting: Family

## 2018-07-22 DIAGNOSIS — R5383 Other fatigue: Secondary | ICD-10-CM

## 2018-07-22 NOTE — Progress Notes (Signed)
Based on what you shared with me, I feel your condition warrants further evaluation and I recommend that you be seen for a face to face office visit.   NOTE: If you entered your credit card information for this eVisit, you will not be charged. You may see a "hold" on your card for the $35 but that hold will drop off and you will not have a charge processed.  If you are having a true medical emergency please call 911.  If you need an urgent face to face visit, [facility] has four urgent care centers for your convenience.  If you need care fast and have a high deductible or no insurance consider:   [email or web address] to reserve your spot online an avoid wait times  [facility] [address], [address] [place], [place] [address] 8 am to 8 pm Monday-Friday 10 am to 4 pm Saturday-Sunday *Across the street from [employer]  [facility]  [address] [place], [address] 8 am to 5 pm Monday-Friday * In the [facility]   The following sites will take your insurance:  . [facility]  [phone] Get Driving Directions Find a Provider at this Location  [address] [place], [place] [address] . 10 am to 8 pm Monday-Friday . 12 pm to 8 pm Saturday-Sunday   . [facility]  [phone] Get Driving Directions Find a Provider at this Location  [address], [address] [place], [place] [address] . 8 am to 8 pm Monday-Friday . 9 am to 6 pm Saturday . 11 am to 6 pm Sunday   . [facility] [place]  [phone] Get Driving Directions  [address] [place], [place] [address] . 8 am to 8 pm Monday-Friday . 8 am to 4 pm Saturday-Sunday   Your e-visit answers were reviewed by a board certified advanced clinical practitioner to complete your personal care plan.  Thank you for using e-Visits.  

## 2018-07-23 ENCOUNTER — Telehealth: Payer: Self-pay | Admitting: Emergency Medicine

## 2018-07-23 ENCOUNTER — Ambulatory Visit: Payer: Self-pay | Admitting: Emergency Medicine

## 2018-07-23 ENCOUNTER — Other Ambulatory Visit: Payer: Self-pay

## 2018-07-23 ENCOUNTER — Ambulatory Visit: Payer: Self-pay

## 2018-07-23 DIAGNOSIS — R6889 Other general symptoms and signs: Secondary | ICD-10-CM

## 2018-07-23 NOTE — Telephone Encounter (Signed)
Pt. Called with c/o onset fever, shortness of breath, body aches, and fatigue, since Monday, 3/16.  Temp. this AM 99.5.  Reported she has alternating chills and sweats.  Reported she went to the ER for her symptoms 3/17, and also had an E-Visit on 3/18.  Reported tested for Flu; negative results.  Stated she feels a lot worse today.  Is requesting to be tested for COVID 19.  Reported she has not traveled, but has been exposed to multiple people from all over the world with her job, during the [employer].     Called nurse at [facility].  Was advised to schedule pt. An appt. Today.  Appt. sched. at 11:20 with Dr. Neva Seat.  Pt. Was advised to wear a mask when out in community.  Verb. Understanding.  Agreed with plan.       Reason for Disposition . SEVERE coughing spells (e.g., whooping sound after coughing, vomiting after coughing)  Answer Assessment - Initial Assessment Questions 1. ONSET: "When did the cough begin?"      Monday, 3/16 2. SEVERITY: "How bad is the cough today?"      Moderate cough 3. RESPIRATORY DISTRESS: "Describe your breathing."      Short of breath at rest since Tuesday 4. FEVER: "Do you have a fever?" If so, ask: "What is your temperature, how was it measured, and when did it start?"     Temp. 99.5 this AM; chills and sweats  5. SPUTUM: "Describe the color of your sputum" (clear, white, yellow, green)     Dry  6. HEMOPTYSIS: "Are you coughing up any blood?" If so ask: "How much?" (flecks, streaks, tablespoons, etc.)     n/a 7. CARDIAC HISTORY: "Do you have any history of heart disease?" (e.g., heart attack, congestive heart failure)      *No Answer* 8. LUNG HISTORY: "Do you have any history of lung disease?"  (e.g., pulmonary embolus, asthma, emphysema)     Denied asthma 9. PE RISK FACTORS: "Do you have a history of blood clots?" (or: recent major surgery, recent prolonged travel, bedridden)     *No Answer* 10. OTHER SYMPTOMS: "Do you have any other  symptoms?" (e.g., runny nose, wheezing, chest pain)       Fever, body aches, SOB, dry cough,  11. PREGNANCY: "Is there any chance you are pregnant?" "When was your last menstrual period?"       LMP: started 3/18 12. TRAVEL: "Have you traveled out of the country in the last month?" (e.g., travel history, exposures)      Denied any travel; Was exposed to travelers through the [facility] basketball tournament  Protocols used: COUGH - ACUTE PRODUCTIVE-A-AH

## 2018-07-23 NOTE — Telephone Encounter (Addendum)
Provider reviewed chart.  Based on s/s and previous evaluations, sending pt directly for testing.    Pt notified.  PUI completed and faxed to [facility].  Confirmation of faxed received x 2.  Other documentation sent through MyChart.

## 2018-07-28 ENCOUNTER — Ambulatory Visit (INDEPENDENT_AMBULATORY_CARE_PROVIDER_SITE_OTHER): Payer: 59 | Admitting: Psychology

## 2018-07-28 DIAGNOSIS — F4322 Adjustment disorder with anxiety: Secondary | ICD-10-CM

## 2018-07-28 DIAGNOSIS — F4310 Post-traumatic stress disorder, unspecified: Secondary | ICD-10-CM | POA: Diagnosis not present

## 2018-07-28 DIAGNOSIS — F330 Major depressive disorder, recurrent, mild: Secondary | ICD-10-CM | POA: Diagnosis not present

## 2018-07-28 LAB — NOVEL CORONAVIRUS, NAA: SARS-CoV-2, NAA: NOT DETECTED

## 2018-07-30 ENCOUNTER — Telehealth: Payer: Self-pay | Admitting: Family Medicine

## 2018-07-30 NOTE — Telephone Encounter (Signed)
Incoming call from Patient requesting a return call from PCP-  Dr.  Koren Shiver.  Patient wants to the Sx.  She is experiencing and moving forward what to expect.

## 2018-07-30 NOTE — Telephone Encounter (Signed)
Pt just would like a call about concerns about the testing and what is her next steps

## 2018-07-30 NOTE — Telephone Encounter (Signed)
This encounter was created in error - please disregard.

## 2018-07-31 ENCOUNTER — Telehealth: Payer: Self-pay | Admitting: Family Medicine

## 2018-07-31 NOTE — Telephone Encounter (Signed)
Copied from CRM [id number]. Topic: Quick Communication - Rx Refill/Question >> Jul 31, 2018  3:56 PM Maria Morse wrote: Medication: Patient was asked to reschedule appointment But she wants to make sure her medications will be continued.    Preferred Pharmacy (with phone number or street name): [facility] [place], [place] - [address] [phone] (Phone) [phone] (Fax)    Agent: Please be advised that RX refills may take up to 3 business days. We ask that you follow-up with your pharmacy.

## 2018-08-01 NOTE — Telephone Encounter (Signed)
Her covid 19 and flu tests were negative. She had a normal viral illness, ie a common cold, cough is the last thing to resolve, can lasts couple of weeks. She might be having some allergies if still having other symptoms. She should be seen if needed. thanks

## 2018-08-04 NOTE — Telephone Encounter (Signed)
Medications are current and should not need refills until [id number]

## 2018-08-04 NOTE — Telephone Encounter (Signed)
Called pt and LVM to call office back to schedule an Tele-med appointment with [place] to go over concerns.

## 2018-08-06 ENCOUNTER — Ambulatory Visit: Payer: 59 | Admitting: Family Medicine

## 2018-08-11 ENCOUNTER — Ambulatory Visit (INDEPENDENT_AMBULATORY_CARE_PROVIDER_SITE_OTHER): Payer: 59 | Admitting: Psychology

## 2018-08-11 DIAGNOSIS — F4322 Adjustment disorder with anxiety: Secondary | ICD-10-CM | POA: Diagnosis not present

## 2018-08-11 DIAGNOSIS — F330 Major depressive disorder, recurrent, mild: Secondary | ICD-10-CM | POA: Diagnosis not present

## 2018-08-11 DIAGNOSIS — F4310 Post-traumatic stress disorder, unspecified: Secondary | ICD-10-CM

## 2018-08-25 ENCOUNTER — Ambulatory Visit: Payer: 59 | Admitting: Psychology

## 2018-08-26 ENCOUNTER — Ambulatory Visit (INDEPENDENT_AMBULATORY_CARE_PROVIDER_SITE_OTHER): Payer: 59 | Admitting: Psychology

## 2018-08-26 DIAGNOSIS — F4322 Adjustment disorder with anxiety: Secondary | ICD-10-CM | POA: Diagnosis not present

## 2018-09-08 ENCOUNTER — Ambulatory Visit (INDEPENDENT_AMBULATORY_CARE_PROVIDER_SITE_OTHER): Payer: 59 | Admitting: Psychology

## 2018-09-08 DIAGNOSIS — F330 Major depressive disorder, recurrent, mild: Secondary | ICD-10-CM

## 2018-09-08 DIAGNOSIS — F4322 Adjustment disorder with anxiety: Secondary | ICD-10-CM

## 2018-09-08 DIAGNOSIS — F4310 Post-traumatic stress disorder, unspecified: Secondary | ICD-10-CM | POA: Diagnosis not present

## 2018-09-16 ENCOUNTER — Ambulatory Visit: Payer: Self-pay | Admitting: Psychology

## 2018-09-18 NOTE — Progress Notes (Signed)
Greater than 5 minutes, yet less than 10 minutes of time have been spent researching, coordinating, and implementing care for this patient today.  Thank you for the details you included in the comment boxes. Those details are very helpful in determining the best course of treatment for you and help us to provide the best care.  

## 2018-09-21 ENCOUNTER — Other Ambulatory Visit: Payer: Self-pay | Admitting: Family Medicine

## 2018-10-06 ENCOUNTER — Ambulatory Visit (INDEPENDENT_AMBULATORY_CARE_PROVIDER_SITE_OTHER): Payer: 59 | Admitting: Psychology

## 2018-10-06 DIAGNOSIS — F4322 Adjustment disorder with anxiety: Secondary | ICD-10-CM | POA: Diagnosis not present

## 2018-10-06 DIAGNOSIS — F4310 Post-traumatic stress disorder, unspecified: Secondary | ICD-10-CM

## 2018-10-06 DIAGNOSIS — F330 Major depressive disorder, recurrent, mild: Secondary | ICD-10-CM

## 2018-10-07 ENCOUNTER — Ambulatory Visit: Payer: Self-pay | Admitting: Family Medicine

## 2018-10-07 ENCOUNTER — Telehealth: Payer: Self-pay | Admitting: Family Medicine

## 2018-10-07 ENCOUNTER — Telehealth (INDEPENDENT_AMBULATORY_CARE_PROVIDER_SITE_OTHER): Payer: 59 | Admitting: Family Medicine

## 2018-10-07 DIAGNOSIS — F4310 Post-traumatic stress disorder, unspecified: Secondary | ICD-10-CM | POA: Diagnosis not present

## 2018-10-07 DIAGNOSIS — Z7189 Other specified counseling: Secondary | ICD-10-CM

## 2018-10-07 MED ORDER — PRAZOSIN HCL 1 MG PO CAPS: 1.0000 mg | ORAL_CAPSULE | Freq: Two times a day (BID) | ORAL | 2 refills | Status: DC

## 2018-10-07 MED ORDER — DULOXETINE HCL 60 MG PO CPEP: 60.0000 mg | ORAL_CAPSULE | Freq: Every day | ORAL | 3 refills | Status: DC

## 2018-10-07 NOTE — Telephone Encounter (Signed)
10/07/2018 - PATIENT HAD A TELEMED VISIT WITH DR. Darcel Bayley ON WED. (10/07/2018). DR. Darcel Bayley HAS SUGGESTED PATIENT HAVE A FOLLOW-UP VISIT IN 1 MONTH (TELEMED). I TRIED TO CALL AND SCHEDULE BUT HAD TO LEAVE HER A VOICE MAIL TO RETURN OUR CALL. MBC

## 2018-10-07 NOTE — Progress Notes (Signed)
Was told today that she has been exposed to some one with covid. Has not had any symptoms. has been taking her temp last reading 98.0. Also needing refills on the medication. Has been having bouts with anxiety for the past few wks. Completed the gad for scoring 21. This is not a regular emotion for her.

## 2018-10-07 NOTE — Telephone Encounter (Signed)
Someone in my office tested positive for COVID-19 and my job wants me tested.  He works in my office the co-worker.  I have an appt at 8:40 this morning with Dr. Leretha Pol.  I didn't know what I should do.  I have a headache  Now is my only symptom.  I called Primary Care at [place] and spoke with [facility].   She checked with Dr. Leretha Pol and Dr. Leretha Pol is going to call the pt at her appt time of 8:40.  I let the pt know this.   I verified that her phone number in her chart was correct and it is.  She was agreeable to this plan.
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
## 2018-10-14 ENCOUNTER — Ambulatory Visit (INDEPENDENT_AMBULATORY_CARE_PROVIDER_SITE_OTHER): Payer: 59 | Admitting: Psychology

## 2018-10-14 DIAGNOSIS — F4310 Post-traumatic stress disorder, unspecified: Secondary | ICD-10-CM

## 2018-10-14 DIAGNOSIS — F330 Major depressive disorder, recurrent, mild: Secondary | ICD-10-CM | POA: Diagnosis not present

## 2018-10-14 DIAGNOSIS — F4322 Adjustment disorder with anxiety: Secondary | ICD-10-CM

## 2018-10-21 ENCOUNTER — Ambulatory Visit (INDEPENDENT_AMBULATORY_CARE_PROVIDER_SITE_OTHER): Payer: 59 | Admitting: Psychology

## 2018-10-21 DIAGNOSIS — F330 Major depressive disorder, recurrent, mild: Secondary | ICD-10-CM | POA: Diagnosis not present

## 2018-10-21 DIAGNOSIS — F4322 Adjustment disorder with anxiety: Secondary | ICD-10-CM

## 2018-10-21 DIAGNOSIS — F4310 Post-traumatic stress disorder, unspecified: Secondary | ICD-10-CM | POA: Diagnosis not present

## 2018-11-03 ENCOUNTER — Ambulatory Visit (INDEPENDENT_AMBULATORY_CARE_PROVIDER_SITE_OTHER): Payer: 59 | Admitting: Psychology

## 2018-11-03 DIAGNOSIS — F4322 Adjustment disorder with anxiety: Secondary | ICD-10-CM

## 2018-11-03 DIAGNOSIS — F4310 Post-traumatic stress disorder, unspecified: Secondary | ICD-10-CM

## 2018-11-03 DIAGNOSIS — F330 Major depressive disorder, recurrent, mild: Secondary | ICD-10-CM | POA: Diagnosis not present

## 2018-11-10 ENCOUNTER — Encounter: Payer: Self-pay | Admitting: Family Medicine

## 2018-11-10 ENCOUNTER — Ambulatory Visit (INDEPENDENT_AMBULATORY_CARE_PROVIDER_SITE_OTHER): Payer: 59 | Admitting: Family Medicine

## 2018-11-10 ENCOUNTER — Other Ambulatory Visit: Payer: Self-pay

## 2018-11-10 VITALS — BP 132/87 | HR 100 | Temp 98.6°F | Ht 66.0 in | Wt 246.0 lb

## 2018-11-10 DIAGNOSIS — F3341 Major depressive disorder, recurrent, in partial remission: Secondary | ICD-10-CM | POA: Diagnosis not present

## 2018-11-10 DIAGNOSIS — F4310 Post-traumatic stress disorder, unspecified: Secondary | ICD-10-CM | POA: Diagnosis not present

## 2018-11-10 DIAGNOSIS — I10 Essential (primary) hypertension: Secondary | ICD-10-CM

## 2018-11-10 NOTE — Progress Notes (Signed)
7/7/20209:21 AM  Maria BeckKathryn Morse October 15, 1975, 43 y.o., female [id number]  Chief Complaint  Patient presents with  . Hypertension  . Post-Traumatic Stress Disorder    feels the medication is working out pretty good, wants to make sure the medication is being taken correctly    HPI:   Patient is a 43 y.o. female with past medical history significant for HTN, chronic fatigue, PTSD and vit D deficiency who presents today for routine followup  Last OV June 2020 - telemedicine Added prazosin  Has been taking 1mg  prazosin at night Having more vivid dreams, not trauma related, not disturbing hypervigilance better but still present Continues to have panic attacks, they started with covid Denies any worsening morning fatigue, cp, dizziness, overall tolerating prazosin well Depression remains well controlled Continues to do counseling, mostly every other week Does not think she has more dental work needed, does have upcoming appt for check up Gad 7 and PH2 noted  GAD 7 : Generalized Anxiety Score 11/10/2018 06/24/2018 03/24/2018 08/29/2017  Nervous, Anxious, on Edge 1 2 1 2   Control/stop worrying 1 2 1 2   Worry too much - different things 1 2 1 3   Trouble relaxing 1 1 1 3   Restless 0 0 0 0  Easily annoyed or irritable 1 2 1 2   Afraid - awful might happen 2 2 1 2   Total GAD 7 Score 7 11 6 14   Anxiety Difficulty Somewhat difficult Very difficult Somewhat difficult Very difficult     Depression screen [facility] 2/9 11/10/2018 06/24/2018 03/24/2018  Decreased Interest 0 1 0  Down, Depressed, Hopeless 0 2 0  PHQ - 2 Score 0 3 0  Altered sleeping - 2 1  Tired, decreased energy - 2 1  Change in appetite - 2 1  Feeling bad or failure about yourself  - 1 0  Trouble concentrating - 1 1  Moving slowly or fidgety/restless - 0 1  Suicidal thoughts - 0 0  PHQ-9 Score - 11 5  Difficult doing work/chores - Very difficult Somewhat difficult    Fall Risk  11/10/2018 06/24/2018 03/24/2018 09/25/2017  08/29/2017  Falls in the past year? 0 0 0 No No  Number falls in past yr: 0 0 - - -  Injury with Fall? 0 0 - - -     No Known Allergies  Prior to Admission medications   Medication Sig Start Date End Date Taking? Authorizing Provider  cetirizine (ZYRTEC) 10 MG tablet Take 10 mg by mouth daily.    Yes [provider]  DULoxetine (CYMBALTA) 60 MG capsule Take 1 capsule (60 mg total) by mouth daily. 10/07/18  Yes Myles LippsSantiago, Runa Whittingham M, MD  fluticasone (FLONASE) 50 MCG/ACT nasal spray Place 1 spray into both nostrils 2 (two) times daily. Patient taking differently: Place 1 spray into both nostrils 2 (two) times daily as needed for allergies.  09/03/17  Yes Myles LippsSantiago, Jonica Bickhart M, MD  hydrochlorothiazide (HYDRODIURIL) 25 MG tablet Take 1 tablet (25 mg total) by mouth daily. 06/24/18  Yes Myles LippsSantiago, Lavarr President M, MD  losartan (COZAAR) 50 MG tablet Take 1 tablet (50 mg total) by mouth daily. 06/24/18  Yes Myles LippsSantiago, Bannie Lobban M, MD  prazosin (MINIPRESS) 1 MG capsule Take 1 capsule (1 mg total) by mouth 2 (two) times daily. 10/07/18  Yes Myles LippsSantiago, Jamese Trauger M, MD    Past Medical History:  Diagnosis Date  . Anxiety   . Depression   . Hypertension   . PTSD (post-traumatic stress disorder) 2007  No past surgical history on file.  Social History   Tobacco Use  . Smoking status: Never Smoker  . Smokeless tobacco: Never Used  Substance Use Topics  . Alcohol use: Never    Frequency: Never    Family History  Problem Relation Age of Onset  . Healthy Mother   . Hypertension Father   . Hyperlipidemia Father   . Healthy Sister   . Healthy Brother     ROS Per hpi  OBJECTIVE:  Today's Vitals   11/10/18 0908  BP: 132/87  Pulse: 100  Temp: 98.6 F (37 C)  TempSrc: Oral  SpO2: 96%  Weight: 246 lb (111.6 kg)  Height: 5\' 6"  (1.676 m)   Body mass index is 39.71 kg/m.   Physical Exam Vitals signs and nursing note reviewed.  Constitutional:      Appearance: She is well-developed.  HENT:     Head:  Normocephalic and atraumatic.  Eyes:     General: No scleral icterus.    Conjunctiva/sclera: Conjunctivae normal.     Pupils: Pupils are equal, round, and reactive to light.  Neck:     Musculoskeletal: Neck supple.  Pulmonary:     Effort: Pulmonary effort is normal.  Skin:    General: Skin is warm and dry.  Neurological:     Mental Status: She is alert and oriented to person, place, and time.     ASSESSMENT and PLAN  1. PTSD (post-traumatic stress disorder) Improved. Increase prazosin to 1mg  BID. Reviewed r/se/b. Cont cymbalta. Cont counseling. Consider small bzd rx for dental work.  2. Recurrent major depressive disorder, in partial remission ([place]) Controlled. Continue current regime.   3. Essential hypertension, benign Controlled. Continue current regime. Labs at next visit.   Return in about 3 months (around 02/10/2019).    Rutherford Guys, MD Primary Care at [place], [place] [address] Ph.  [phone] Fax [phone]

## 2018-11-10 NOTE — Patient Instructions (Signed)
° ° ° °  If you have lab work done today you will be contacted with your lab results within the next 2 weeks.  If you have not heard from us then please contact us. The fastest way to get your results is to register for My Chart. ° ° °IF you received an x-ray today, you will receive an invoice from [facility]. Please contact [facility] at [phone] with questions or concerns regarding your invoice.  ° °IF you received labwork today, you will receive an invoice from [facility]. Please contact [facility] at [phone] with questions or concerns regarding your invoice.  ° °Our billing staff will not be able to assist you with questions regarding bills from these companies. ° °You will be contacted with the lab results as soon as they are available. The fastest way to get your results is to activate your My Chart account. Instructions are located on the last page of this paperwork. If you have not heard from us regarding the results in 2 weeks, please contact this office. °  ° ° ° °

## 2018-11-18 ENCOUNTER — Ambulatory Visit (INDEPENDENT_AMBULATORY_CARE_PROVIDER_SITE_OTHER): Payer: 59 | Admitting: Psychology

## 2018-11-18 DIAGNOSIS — F4322 Adjustment disorder with anxiety: Secondary | ICD-10-CM

## 2018-12-02 ENCOUNTER — Ambulatory Visit (INDEPENDENT_AMBULATORY_CARE_PROVIDER_SITE_OTHER): Payer: 59 | Admitting: Psychology

## 2018-12-02 DIAGNOSIS — F4322 Adjustment disorder with anxiety: Secondary | ICD-10-CM

## 2018-12-18 ENCOUNTER — Ambulatory Visit (INDEPENDENT_AMBULATORY_CARE_PROVIDER_SITE_OTHER): Payer: 59 | Admitting: Psychology

## 2018-12-18 DIAGNOSIS — F4310 Post-traumatic stress disorder, unspecified: Secondary | ICD-10-CM | POA: Diagnosis not present

## 2018-12-18 DIAGNOSIS — F4322 Adjustment disorder with anxiety: Secondary | ICD-10-CM

## 2018-12-18 DIAGNOSIS — F330 Major depressive disorder, recurrent, mild: Secondary | ICD-10-CM

## 2018-12-23 ENCOUNTER — Other Ambulatory Visit: Payer: Self-pay | Admitting: Family Medicine

## 2018-12-24 ENCOUNTER — Other Ambulatory Visit: Payer: Self-pay | Admitting: Family Medicine

## 2018-12-28 ENCOUNTER — Telehealth: Payer: Self-pay

## 2018-12-28 ENCOUNTER — Telehealth (INDEPENDENT_AMBULATORY_CARE_PROVIDER_SITE_OTHER): Payer: 59 | Admitting: Registered Nurse

## 2018-12-28 ENCOUNTER — Encounter: Payer: Self-pay | Admitting: Registered Nurse

## 2018-12-28 DIAGNOSIS — R1013 Epigastric pain: Secondary | ICD-10-CM

## 2018-12-28 MED ORDER — OMEPRAZOLE 40 MG PO CPDR: 40.0000 mg | DELAYED_RELEASE_CAPSULE | Freq: Every day | ORAL | 3 refills | Status: DC

## 2018-12-28 MED ORDER — SUCRALFATE 1 GM/10ML PO SUSP: 1.0000 g | Freq: Three times a day (TID) | ORAL | 0 refills | Status: DC

## 2018-12-28 NOTE — Progress Notes (Signed)
Telemedicine Encounter- SOAP NOTE Established Patient  This telephone encounter was conducted with the patient's (or proxy's) verbal consent via audio telecommunications: yes   Patient was instructed to have this encounter in a suitably private space; and to only have persons present to whom they give permission to participate. In addition, patient identity was confirmed by use of name plus two identifiers (DOB and address).  I discussed the limitations, risks, security and privacy concerns of performing an evaluation and management service by telephone and the availability of in person appointments. I also discussed with the patient that there may be a patient responsible charge related to this service. The patient expressed understanding and agreed to proceed.  I spent a total of 22 minutes talking with the patient or their proxy.  No chief complaint on file.   Subjective   Maria Morse is a 43 y.o. established patient. Telephone visit today for abdominal pain  HPI Pt states pain started on Saturday evening. Has been as bad as 8/10, but now is 3-4/10 with ibuprofen. Additionally, pt endorses fever that has since broken, fatigue, and nausea. She states the pain is midline above the umbilicus. She reports some constipation, for which she has taken MiraLax with good effect, but has not had BM in over 24 hours at this time, which is abnormal for her.  She states that she had one instance of dark, tarry stool on Sunday, but has been normal since. She states that she has frequent GERD. No known sick contacts. Denies SHOB, chest pain, headaches, cough.  Paternal aunt with hx of stomach cancer, otherwise, no GI cancer in family. Mother has diverticulosis and has multiple flare ups of diverticulitis.  Patient Active Problem List   Diagnosis Date Noted  . Chronic fatigue syndrome 06/24/2018  . Elevated blood-pressure reading without diagnosis of hypertension 06/24/2018  . Menorrhagia  06/24/2018  . Vitamin D deficiency 06/24/2018  . Weight gain 06/24/2018  . Need for vaccination 09/25/2017  . Essential hypertension, benign 09/03/2017  . PTSD (post-traumatic stress disorder) 09/03/2017  . Recurrent major depressive disorder, in partial remission (HCC) 09/03/2017  . Seasonal allergies 09/03/2017  . Acute non-recurrent maxillary sinusitis 02/04/2017  . Contact dermatitis 02/04/2017    Past Medical History:  Diagnosis Date  . Anxiety   . Depression   . Hypertension   . PTSD (post-traumatic stress disorder) 2007    Current Outpatient Medications  Medication Sig Dispense Refill  . cetirizine (ZYRTEC) 10 MG tablet Take 10 mg by mouth daily.     . DULoxetine (CYMBALTA) 60 MG capsule Take 1 capsule (60 mg total) by mouth daily. 30 capsule 3  . fluticasone (FLONASE) 50 MCG/ACT nasal spray Place 1 spray into both nostrils 2 (two) times daily. (Patient taking differently: Place 1 spray into both nostrils 2 (two) times daily as needed for allergies. ) 16 g 6  . hydrochlorothiazide (HYDRODIURIL) 25 MG tablet TAKE ONE TABLET BY MOUTH DAILY 30 tablet 1  . losartan (COZAAR) 50 MG tablet Take 1 tablet (50 mg total) by mouth daily. 90 tablet 1  . prazosin (MINIPRESS) 1 MG capsule TAKE ONE CAPSULE BY MOUTH TWICE A DAY 60 capsule 1  . omeprazole (PRILOSEC) 40 MG capsule Take 1 capsule (40 mg total) by mouth daily. 90 capsule 3  . sucralfate (CARAFATE) 1 GM/10ML suspension Take 10 mLs (1 g total) by mouth 4 (four) times daily -  with meals and at bedtime. 420 mL 0   No current facility-administered medications  for this visit.     No Known Allergies  Social History   Socioeconomic History  . Marital status: Single    Spouse name: Not on file  . Number of children: Not on file  . Years of education: Not on file  . Highest education level: Not on file  Occupational History  . Not on file  Social Needs  . Financial resource strain: Not on file  . Food insecurity    Worry:  Not on file    Inability: Not on file  . Transportation needs    Medical: Not on file    Non-medical: Not on file  Tobacco Use  . Smoking status: Never Smoker  . Smokeless tobacco: Never Used  Substance and Sexual Activity  . Alcohol use: Never    Frequency: Never  . Drug use: Never  . Sexual activity: Not Currently  Lifestyle  . Physical activity    Days per week: Not on file    Minutes per session: Not on file  . Stress: Not on file  Relationships  . Social Herbalist on phone: Not on file    Gets together: Not on file    Attends religious service: Not on file    Active member of club or organization: Not on file    Attends meetings of clubs or organizations: Not on file    Relationship status: Not on file  . Intimate partner violence    Fear of current or ex partner: Not on file    Emotionally abused: Not on file    Physically abused: Not on file    Forced sexual activity: Not on file  Other Topics Concern  . Not on file  Social History Narrative  . Not on file    ROS Per hpi   Objective   Vitals as reported by the patient: There were no vitals filed for this visit.  Diagnoses and all orders for this visit:  Acute epigastric pain -     omeprazole (PRILOSEC) 40 MG capsule; Take 1 capsule (40 mg total) by mouth daily. -     sucralfate (CARAFATE) 1 GM/10ML suspension; Take 10 mLs (1 g total) by mouth 4 (four) times daily -  with meals and at bedtime.   PLAN  Suspicion for GI bleed / peptic ulcer. Pt has not experienced LOC or gross blood in stool - is hesitant to proceed to ED at this time d/t COVID. Given strict ED precautions.   She will present for labs in our office tomorrow morning. CBC, CMP, COVID-19, TSH to be ordered.  Will follow as warranted. If she is anemic, she is concerning d/t her recent history of heavy menses. She will require close follow up with myself or her PCP Dr. Pamella Pert.  Patient encouraged to call clinic with any  questions, comments, or concerns.    I discussed the assessment and treatment plan with the patient. The patient was provided an opportunity to ask questions and all were answered. The patient agreed with the plan and demonstrated an understanding of the instructions.   The patient was advised to call back or seek an in-person evaluation if the symptoms worsen or if the condition fails to improve as anticipated.  I provided 22 minutes of non-face-to-face time during this encounter.  Maximiano Coss, NP  Primary Care at [facility]

## 2018-12-28 NOTE — Progress Notes (Signed)
Spoke with pt and and she states she has been having some abdominal pain x 2 days.She states there was some fever yesterday at 100 but has went down after taking some OTC medication.  She also states she has been feeling fatigue, having nausea and headache. The pain is in the middle of her abdomin.

## 2018-12-29 ENCOUNTER — Other Ambulatory Visit: Payer: Self-pay | Admitting: Registered Nurse

## 2018-12-29 ENCOUNTER — Ambulatory Visit (INDEPENDENT_AMBULATORY_CARE_PROVIDER_SITE_OTHER): Payer: 59 | Admitting: Family Medicine

## 2018-12-29 ENCOUNTER — Other Ambulatory Visit: Payer: Self-pay

## 2018-12-29 DIAGNOSIS — N920 Excessive and frequent menstruation with regular cycle: Secondary | ICD-10-CM

## 2018-12-29 DIAGNOSIS — R1013 Epigastric pain: Secondary | ICD-10-CM

## 2018-12-29 DIAGNOSIS — R5383 Other fatigue: Secondary | ICD-10-CM

## 2018-12-30 ENCOUNTER — Encounter: Payer: Self-pay | Admitting: Registered Nurse

## 2018-12-30 ENCOUNTER — Encounter: Payer: Self-pay | Admitting: Gastroenterology

## 2018-12-30 DIAGNOSIS — R1013 Epigastric pain: Secondary | ICD-10-CM

## 2018-12-30 LAB — CBC WITH DIFFERENTIAL/PLATELET
Basophils Absolute: 0.1 10*3/uL (ref 0.0–0.2)
Basos: 1 %
EOS (ABSOLUTE): 0.2 10*3/uL (ref 0.0–0.4)
Eos: 2 %
Hematocrit: 36.9 % (ref 34.0–46.6)
Hemoglobin: 12.1 g/dL (ref 11.1–15.9)
Immature Grans (Abs): 0 10*3/uL (ref 0.0–0.1)
Immature Granulocytes: 0 %
Lymphocytes Absolute: 2.5 10*3/uL (ref 0.7–3.1)
Lymphs: 24 %
MCH: 27.9 pg (ref 26.6–33.0)
MCHC: 32.8 g/dL (ref 31.5–35.7)
MCV: 85 fL (ref 79–97)
Monocytes Absolute: 0.4 10*3/uL (ref 0.1–0.9)
Monocytes: 4 %
Neutrophils Absolute: 7.1 10*3/uL — ABNORMAL HIGH (ref 1.4–7.0)
Neutrophils: 69 %
Platelets: 341 10*3/uL (ref 150–450)
RBC: 4.34 x10E6/uL (ref 3.77–5.28)
RDW: 12.9 % (ref 11.7–15.4)
WBC: 10.3 10*3/uL (ref 3.4–10.8)

## 2018-12-30 LAB — COMPREHENSIVE METABOLIC PANEL
ALT: 13 IU/L (ref 0–32)
AST: 15 IU/L (ref 0–40)
Albumin/Globulin Ratio: 1.8 (ref 1.2–2.2)
Albumin: 4.4 g/dL (ref 3.8–4.8)
Alkaline Phosphatase: 76 IU/L (ref 39–117)
BUN/Creatinine Ratio: 8 — ABNORMAL LOW (ref 9–23)
BUN: 7 mg/dL (ref 6–24)
Bilirubin Total: <0.2 mg/dL (ref 0.0–1.2)
CO2: 22 mmol/L (ref 20–29)
Calcium: 9.2 mg/dL (ref 8.7–10.2)
Chloride: 100 mmol/L (ref 96–106)
Creatinine, Ser: 0.83 mg/dL (ref 0.57–1.00)
GFR calc Af Amer: 101 mL/min/{1.73_m2} (ref >59)
GFR calc non Af Amer: 87 mL/min/{1.73_m2} (ref >59)
Globulin, Total: 2.4 g/dL (ref 1.5–4.5)
Glucose: 119 mg/dL — ABNORMAL HIGH (ref 65–99)
Potassium: 4 mmol/L (ref 3.5–5.2)
Sodium: 138 mmol/L (ref 134–144)
Total Protein: 6.8 g/dL (ref 6.0–8.5)

## 2018-12-30 LAB — TSH: TSH: 2.81 u[IU]/mL (ref 0.450–4.500)

## 2018-12-30 LAB — IRON,TIBC AND FERRITIN PANEL
Ferritin: 140 ng/mL (ref 15–150)
Iron Saturation: 8 % — CL (ref 15–55)
Iron: 24 ug/dL — ABNORMAL LOW (ref 27–159)
Total Iron Binding Capacity: 317 ug/dL (ref 250–450)
UIBC: 293 ug/dL (ref 131–425)

## 2018-12-30 NOTE — Progress Notes (Signed)
Hi Dr. Pamella Pert, I saw Ms. Maria Morse for a virtual visit for abdominal pain suspicious for a peptic ulcer, we drew some labs given that she's had a history of AUB, and her iron panel came back somewhat concerning.  She has started omeprazole and sucralfate which have provided some symptom relief, but I will be referring her to GI for workup.  She has a follow up scheduled with you on Sept 8.  Thank you, Kathrin Ruddy, NP

## 2018-12-30 NOTE — Progress Notes (Signed)
Pt had telemed visit for epigastric pain suspicious for peptic ulcer Is having relief from pain with omeprazole and sucralfate Will refer to GI Results letter sent via MyChart and results will be released to patient  Kathrin Ruddy, NP

## 2019-01-01 ENCOUNTER — Ambulatory Visit (INDEPENDENT_AMBULATORY_CARE_PROVIDER_SITE_OTHER): Payer: 59 | Admitting: Psychology

## 2019-01-01 DIAGNOSIS — F4310 Post-traumatic stress disorder, unspecified: Secondary | ICD-10-CM | POA: Diagnosis not present

## 2019-01-01 DIAGNOSIS — F330 Major depressive disorder, recurrent, mild: Secondary | ICD-10-CM | POA: Diagnosis not present

## 2019-01-01 DIAGNOSIS — F4322 Adjustment disorder with anxiety: Secondary | ICD-10-CM | POA: Diagnosis not present

## 2019-01-03 ENCOUNTER — Other Ambulatory Visit: Payer: Self-pay | Admitting: Registered Nurse

## 2019-01-03 DIAGNOSIS — R1013 Epigastric pain: Secondary | ICD-10-CM

## 2019-01-05 ENCOUNTER — Encounter: Payer: Self-pay | Admitting: Gastroenterology

## 2019-01-05 ENCOUNTER — Ambulatory Visit: Payer: 59 | Admitting: Gastroenterology

## 2019-01-05 VITALS — BP 110/80 | HR 90 | Temp 99.2°F | Ht 66.0 in | Wt 247.0 lb

## 2019-01-05 DIAGNOSIS — R110 Nausea: Secondary | ICD-10-CM | POA: Diagnosis not present

## 2019-01-05 DIAGNOSIS — R1033 Periumbilical pain: Secondary | ICD-10-CM

## 2019-01-05 MED ORDER — ONDANSETRON HCL 4 MG PO TABS: 4.0000 mg | ORAL_TABLET | Freq: Three times a day (TID) | ORAL | 0 refills | Status: DC | PRN

## 2019-01-05 NOTE — Patient Instructions (Signed)
If you are age 42 or older, your body mass index should be between 23-30. Your Body mass index is 39.87 kg/m. If this is out of the aforementioned range listed, please consider follow up with your Primary Care Provider.  If you are age 64 or younger, your body mass index should be between 19-25. Your Body mass index is 39.87 kg/m. If this is out of the aformentioned range listed, please consider follow up with your Primary Care Provider.   It was a pleasure to see you today!  Dr. Danis  

## 2019-01-05 NOTE — Progress Notes (Addendum)
[facility] Consult Note:  History: Maria Morse 01/05/2019  Referring provider: Myles Lipps, MD  Reason for consult/chief complaint: Abdominal Pain (pt began experiencing lower abdominal pain for the last week along with nausea, fatigue, fever (100.2) and loose stools; stools appeared to be "very dark brown";  symptoms have improved with Omeprazole and Carafate)   Subjective  HPI:  This is a very pleasant 43 year old woman referred by primary care for abdominal pain.  About 10 days ago she had the acute onset of central abdominal pain that was cramping and tight just below the umbilicus.  She had a low-grade temp to 100.2 and loose stool, though that may have occurred after she took some MiraLAX a day or 2 later.  When that happened, the stool was "very dark".  She had nausea and fatigue.  There is no rectal bleeding.  She has not had a previous episode like this, was concerned it might be diverticulitis since her mother had experienced that in the past.  She had telemedicine with primary care last week and there was some concern about possible ulcer.  Thus, she was prescribed Prilosec and Carafate.  Symptoms have gradually subsided since then and she now has a dull ache in the epigastrium with some persistent nausea.  Several days ago when she lay on the right side at night she felt very nauseated and thought she might vomit.  Bowel habits have returned to normal. ROS:  Review of Systems  Constitutional: Negative for appetite change and unexpected weight change.  HENT: Negative for mouth sores and voice change.   Eyes: Negative for pain and redness.  Respiratory: Negative for cough and shortness of breath.   Cardiovascular: Negative for chest pain and palpitations.  Genitourinary: Negative for dysuria and hematuria.  Musculoskeletal: Positive for back pain. Negative for arthralgias and myalgias.  Skin: Negative for pallor and rash.  Allergic/Immunologic:  Positive for environmental allergies.  Neurological: Positive for headaches. Negative for weakness.  Hematological: Negative for adenopathy.  Psychiatric/Behavioral:       Depression     Past Medical History: Past Medical History:  Diagnosis Date   Anxiety    Chronic headaches    Depression    Hyperlipidemia    Hypertension    PTSD (post-traumatic stress disorder) 2007     Past Surgical History: History reviewed. No pertinent surgical history. No prior surgery  Family History: Family History  Problem Relation Age of Onset   Healthy Mother    Diverticulitis Mother    Hypertension Father    Hyperlipidemia Father    Healthy Sister    Healthy Brother    Stomach cancer Paternal Aunt     Social History: Social History   Socioeconomic History   Marital status: Single    Spouse name: Not on file   Number of children: Not on file   Years of education: Not on file   Highest education level: Not on file  Occupational History   Not on file  Social Needs   Financial resource strain: Not on file   Food insecurity    Worry: Not on file    Inability: Not on file   Transportation needs    Medical: Not on file    Non-medical: Not on file  Tobacco Use   Smoking status: Former Smoker    Quit date: 05/06/1998    Years since quitting: 20.6   Smokeless tobacco: Never Used  Substance and Sexual Activity   Alcohol use: Never  Frequency: Never   Drug use: Never   Sexual activity: Not Currently  Lifestyle   Physical activity    Days per week: Not on file    Minutes per session: Not on file   Stress: Not on file  Relationships   Social connections    Talks on phone: Not on file    Gets together: Not on file    Attends religious service: Not on file    Active member of club or organization: Not on file    Attends meetings of clubs or organizations: Not on file    Relationship status: Not on file  Other Topics Concern   Not on file    Social History Narrative   Not on file    Allergies: No Known Allergies  Outpatient Meds: Current Outpatient Medications  Medication Sig Dispense Refill   cetirizine (ZYRTEC) 10 MG tablet Take 10 mg by mouth daily.      DULoxetine (CYMBALTA) 60 MG capsule Take 1 capsule (60 mg total) by mouth daily. 30 capsule 3   fluticasone (FLONASE) 50 MCG/ACT nasal spray Place 1 spray into both nostrils 2 (two) times daily. (Patient taking differently: Place 1 spray into both nostrils 2 (two) times daily as needed for allergies. ) 16 g 6   hydrochlorothiazide (HYDRODIURIL) 25 MG tablet TAKE ONE TABLET BY MOUTH DAILY 30 tablet 1   losartan (COZAAR) 50 MG tablet Take 1 tablet (50 mg total) by mouth daily. 90 tablet 1   omeprazole (PRILOSEC) 40 MG capsule Take 1 capsule (40 mg total) by mouth daily. 90 capsule 3   prazosin (MINIPRESS) 1 MG capsule TAKE ONE CAPSULE BY MOUTH TWICE A DAY 60 capsule 1   sucralfate (CARAFATE) 1 GM/10ML suspension Take 10 mLs (1 g total) by mouth 4 (four) times daily -  with meals and at bedtime. 420 mL 0   ondansetron (ZOFRAN) 4 MG tablet Take 1 tablet (4 mg total) by mouth every 8 (eight) hours as needed for nausea or vomiting. 20 tablet 0   No current facility-administered medications for this visit.       ___________________________________________________________________ Objective   Exam:  BP 110/80    Pulse 90    Temp 99.2 F (37.3 C)    Ht 5\' 6"  (1.676 m)    Wt 247 lb (112 kg)    BMI 39.87 kg/m    General: Well-appearing, pleasant and conversational.  Well-hydrated  Eyes: sclera anicteric, no redness  ENT: oral mucosa moist without lesions, no cervical or supraclavicular lymphadenopathy  CV: RRR without murmur, S1/S2, no JVD, no peripheral edema  Resp: clear to auscultation bilaterally, normal RR and effort noted  GI: soft, mild epigastric tenderness, with active bowel sounds. No guarding or palpable organomegaly noted.  Skin; warm and  dry, no rash or jaundice noted  Neuro: awake, alert and oriented x 3. Normal gross motor function and fluent speech  Labs  CBC Latest Ref Rng & Units 12/29/2018 08/29/2017  WBC 3.4 - 10.8 x10E3/uL 10.3 10.3  Hemoglobin 11.1 - 15.9 g/dL 09.812.1 11.912.7  Hematocrit 14.734.0 - 46.6 % 36.9 39.0  Platelets 150 - 450 x10E3/uL 341 354   CMP Latest Ref Rng & Units 12/29/2018 06/24/2018 03/11/2018  Glucose 65 - 99 mg/dL 829(F119(H) 91 84  BUN 6 - 24 mg/dL 7 13 13   Creatinine 0.57 - 1.00 mg/dL 6.210.83 3.080.94 6.570.95  Sodium 134 - 144 mmol/L 138 140 138  Potassium 3.5 - 5.2 mmol/L 4.0 4.1 4.1  Chloride 96 -  106 mmol/L 100 104 99  CO2 20 - 29 mmol/L 22 19(L) 23  Calcium 8.7 - 10.2 mg/dL 9.2 9.0 9.7  Total Protein 6.0 - 8.5 g/dL 6.8 6.6 -  Total Bilirubin 0.0 - 1.2 mg/dL <0.2 0.4 -  Alkaline Phos 39 - 117 IU/L 76 67 -  AST 0 - 40 IU/L 15 13 -  ALT 0 - 32 IU/L 13 16 -   Iron 24, TIBC 317 8% saturation, Ferritin 140  TSH normal  Radiologic Studies: None  Assessment: Encounter Diagnoses  Name Primary?   Periumbilical abdominal pain Yes   Nausea without vomiting     Acuity of onset with the symptom complex suggests probable infection that is now subsided with visual symptoms.  Does not seem to be diverticulitis or appendicitis.  I do not think this is typical for ulcer. Blood work reassuring-decreased iron saturation with normal ferritin and hemoglobin are probably from chronic menstrual blood loss and I think unrelated to the GI symptoms.  Plan:  Symptomatic treatment of nausea with Zofran so she can liberalize diet.  No further GI testing planned at present.  If symptoms escalate, I want her to contact me and we would then most likely do a CT scan.  Stop Carafate.  Decrease PPI to every other day for a week and then stop.  Thank you for the courtesy of this consult.  Please call me with any questions or concerns.  Nelida Meuse III  CC: Referring provider noted above

## 2019-01-12 ENCOUNTER — Ambulatory Visit: Payer: 59 | Admitting: Family Medicine

## 2019-01-12 ENCOUNTER — Other Ambulatory Visit: Payer: Self-pay

## 2019-01-19 ENCOUNTER — Ambulatory Visit (INDEPENDENT_AMBULATORY_CARE_PROVIDER_SITE_OTHER): Payer: 59 | Admitting: Psychology

## 2019-01-19 DIAGNOSIS — F330 Major depressive disorder, recurrent, mild: Secondary | ICD-10-CM

## 2019-01-19 DIAGNOSIS — F4310 Post-traumatic stress disorder, unspecified: Secondary | ICD-10-CM

## 2019-01-19 DIAGNOSIS — F4322 Adjustment disorder with anxiety: Secondary | ICD-10-CM

## 2019-01-26 ENCOUNTER — Encounter: Payer: Self-pay | Admitting: Gynecology

## 2019-02-02 ENCOUNTER — Ambulatory Visit (INDEPENDENT_AMBULATORY_CARE_PROVIDER_SITE_OTHER): Payer: 59 | Admitting: Psychology

## 2019-02-02 DIAGNOSIS — F4310 Post-traumatic stress disorder, unspecified: Secondary | ICD-10-CM | POA: Diagnosis not present

## 2019-02-02 DIAGNOSIS — F330 Major depressive disorder, recurrent, mild: Secondary | ICD-10-CM

## 2019-02-02 DIAGNOSIS — F4322 Adjustment disorder with anxiety: Secondary | ICD-10-CM | POA: Diagnosis not present

## 2019-02-09 ENCOUNTER — Other Ambulatory Visit: Payer: Self-pay

## 2019-02-09 ENCOUNTER — Encounter: Payer: Self-pay | Admitting: Family Medicine

## 2019-02-09 ENCOUNTER — Ambulatory Visit: Payer: 59 | Admitting: Family Medicine

## 2019-02-09 VITALS — BP 135/87 | HR 92 | Temp 99.1°F | Ht 66.0 in | Wt 242.0 lb

## 2019-02-09 DIAGNOSIS — F4310 Post-traumatic stress disorder, unspecified: Secondary | ICD-10-CM | POA: Diagnosis not present

## 2019-02-09 DIAGNOSIS — Z6839 Body mass index (BMI) 39.0-39.9, adult: Secondary | ICD-10-CM | POA: Diagnosis not present

## 2019-02-09 DIAGNOSIS — I10 Essential (primary) hypertension: Secondary | ICD-10-CM

## 2019-02-09 NOTE — Patient Instructions (Addendum)
If you have lab work done today you will be contacted with your lab results within the next 2 weeks.  If you have not heard from [place] then please contact us. The fastest way to get your results is to register for My Chart.   IF you received an x-ray today, you will receive an invoice from [facility]. Please contact [facility] at [phone] with questions or concerns regarding your invoice.   IF you received labwork today, you will receive an invoice from [facility]. Please contact [facility] at [phone] with questions or concerns regarding your invoice.   Our billing staff will not be able to assist you with questions regarding bills from these companies.  You will be contacted with the lab results as soon as they are available. The fastest way to get your results is to activate your My Chart account. Instructions are located on the last page of this paperwork. If you have not heard from [place] regarding the results in 2 weeks, please contact this office.     Calorie Counting for Weight Loss Calories are units of energy. Your body needs a certain amount of calories from food to keep you going throughout the day. When you eat more calories than your body needs, your body stores the extra calories as fat. When you eat fewer calories than your body needs, your body burns fat to get the energy it needs. Calorie counting means keeping track of how many calories you eat and drink each day. Calorie counting can be helpful if you need to lose weight. If you make sure to eat fewer calories than your body needs, you should lose weight. Ask your health care provider what a healthy weight is for you. For calorie counting to work, you will need to eat the right number of calories in a day in order to lose a healthy amount of weight per week. A dietitian can help you determine how many calories you need in a day and will give you suggestions on how to reach your calorie goal.  A healthy  amount of weight to lose per week is usually 1-2 lb (0.5-0.9 kg). This usually means that your daily calorie intake should be reduced by 500-750 calories.  Eating 1,200 - 1,500 calories per day can help most women lose weight.  Eating 1,500 - 1,800 calories per day can help most men lose weight. What is my plan? My goal is to have _____1600_____ calories per day. If I have this many calories per day, I should lose around _____1_____ pounds per week. What do I need to know about calorie counting? In order to meet your daily calorie goal, you will need to:  Find out how many calories are in each food you would like to eat. Try to do this before you eat.  Decide how much of the food you plan to eat.  Write down what you ate and how many calories it had. Doing this is called keeping a food log. To successfully lose weight, it is important to balance calorie counting with a healthy lifestyle that includes regular activity. Aim for 150 minutes of moderate exercise (such as walking) or 75 minutes of vigorous exercise (such as running) each week. Where do I find calorie information?  The number of calories in a food can be found on a Nutrition Facts label. If a food does not have a Nutrition Facts label, try to look up the calories online or ask your dietitian for  help. Remember that calories are listed per serving. If you choose to have more than one serving of a food, you will have to multiply the calories per serving by the amount of servings you plan to eat. For example, the label on a package of bread might say that a serving size is 1 slice and that there are 90 calories in a serving. If you eat 1 slice, you will have eaten 90 calories. If you eat 2 slices, you will have eaten 180 calories. How do I keep a food log? Immediately after each meal, record the following information in your food log:  What you ate. Don't forget to include toppings, sauces, and other extras on the food.  How much  you ate. This can be measured in cups, ounces, or number of items.  How many calories each food and drink had.  The total number of calories in the meal. Keep your food log near you, such as in a small notebook in your pocket, or use a mobile app or website. Some programs will calculate calories for you and show you how many calories you have left for the day to meet your goal. What are some calorie counting tips?   Use your calories on foods and drinks that will fill you up and not leave you hungry: ? Some examples of foods that fill you up are nuts and nut butters, vegetables, lean proteins, and high-fiber foods like whole grains. High-fiber foods are foods with more than 5 g fiber per serving. ? Drinks such as sodas, specialty coffee drinks, alcohol, and juices have a lot of calories, yet do not fill you up.  Eat nutritious foods and avoid empty calories. Empty calories are calories you get from foods or beverages that do not have many vitamins or protein, such as candy, sweets, and soda. It is better to have a nutritious high-calorie food (such as an avocado) than a food with few nutrients (such as a bag of chips).  Know how many calories are in the foods you eat most often. This will help you calculate calorie counts faster.  Pay attention to calories in drinks. Low-calorie drinks include water and unsweetened drinks.  Pay attention to nutrition labels for "low fat" or "fat free" foods. These foods sometimes have the same amount of calories or more calories than the full fat versions. They also often have added sugar, starch, or salt, to make up for flavor that was removed with the fat.  Find a way of tracking calories that works for you. Get creative. Try different apps or programs if writing down calories does not work for you. What are some portion control tips?  Know how many calories are in a serving. This will help you know how many servings of a certain food you can have.  Use a  measuring cup to measure serving sizes. You could also try weighing out portions on a kitchen scale. With time, you will be able to estimate serving sizes for some foods.  Take some time to put servings of different foods on your favorite plates, bowls, and cups so you know what a serving looks like.  Try not to eat straight from a bag or box. Doing this can lead to overeating. Put the amount you would like to eat in a cup or on a plate to make sure you are eating the right portion.  Use smaller plates, glasses, and bowls to prevent overeating.  Try not to multitask (for example,  watch TV or use your computer) while eating. If it is time to eat, sit down at a table and enjoy your food. This will help you to know when you are full. It will also help you to be aware of what you are eating and how much you are eating. What are tips for following this plan? Reading food labels  Check the calorie count compared to the serving size. The serving size may be smaller than what you are used to eating.  Check the source of the calories. Make sure the food you are eating is high in vitamins and protein and low in saturated and trans fats. Shopping  Read nutrition labels while you shop. This will help you make healthy decisions before you decide to purchase your food.  Make a grocery list and stick to it. Cooking  Try to cook your favorite foods in a healthier way. For example, try baking instead of frying.  Use low-fat dairy products. Meal planning  Use more fruits and vegetables. Half of your plate should be fruits and vegetables.  Include lean proteins like poultry and fish. How do I count calories when eating out?  Ask for smaller portion sizes.  Consider sharing an entree and sides instead of getting your own entree.  If you get your own entree, eat only half. Ask for a box at the beginning of your meal and put the rest of your entree in it so you are not tempted to eat it.  If calories  are listed on the menu, choose the lower calorie options.  Choose dishes that include vegetables, fruits, whole grains, low-fat dairy products, and lean protein.  Choose items that are boiled, broiled, grilled, or steamed. Stay away from items that are buttered, battered, fried, or served with cream sauce. Items labeled "crispy" are usually fried, unless stated otherwise.  Choose water, low-fat milk, unsweetened iced tea, or other drinks without added sugar. If you want an alcoholic beverage, choose a lower calorie option such as a glass of wine or light beer.  Ask for dressings, sauces, and syrups on the side. These are usually high in calories, so you should limit the amount you eat.  If you want a salad, choose a garden salad and ask for grilled meats. Avoid extra toppings like bacon, cheese, or fried items. Ask for the dressing on the side, or ask for olive oil and vinegar or lemon to use as dressing.  Estimate how many servings of a food you are given. For example, a serving of cooked rice is  cup or about the size of half a baseball. Knowing serving sizes will help you be aware of how much food you are eating at restaurants. The list below tells you how big or small some common portion sizes are based on everyday objects: ? 1 oz-4 stacked dice. ? 3 oz-1 deck of cards. ? 1 tsp-1 die. ? 1 Tbsp- a ping-pong ball. ? 2 Tbsp-1 ping-pong ball. ?  cup- baseball. ? 1 cup-1 baseball. Summary  Calorie counting means keeping track of how many calories you eat and drink each day. If you eat fewer calories than your body needs, you should lose weight.  A healthy amount of weight to lose per week is usually 1-2 lb (0.5-0.9 kg). This usually means reducing your daily calorie intake by 500-750 calories.  The number of calories in a food can be found on a Nutrition Facts label. If a food does not have a Nutrition Facts  label, try to look up the calories online or ask your dietitian for help.  Use  your calories on foods and drinks that will fill you up, and not on foods and drinks that will leave you hungry.  Use smaller plates, glasses, and bowls to prevent overeating. This information is not intended to replace advice given to you by your health care provider. Make sure you discuss any questions you have with your health care provider. Document Released: 04/22/2005 Document Revised: 01/09/2018 Document Reviewed: 03/22/2016 Elsevier Patient Education  2020 [employer].

## 2019-02-09 NOTE — Progress Notes (Signed)
10/6/20209:07 AM  Maria Morse 11/19/1975, 43 y.o., female [id number]  Chief Complaint  Patient presents with  . Follow-up    taking off the prilosec, carafate and zofran by gastro. Said to have had an acute inf    HPI:   Patient is a 43 y.o. female with past medical history significant for HTN, chronic fatigue, PTSD and vit D deficiency who presents today for routine followup  Last OV July 2020 - increased prazosin 1mg  BID Since then saw Dr , GI for abd pain - not PUD, diverticulitis or appendicitis, stopped meds, fu prn  She is overall doing well She feels that increase in prazosin is helping Continues to do counseling every 2 weeks  Patient would like to work on trying to get off BP meds  She will be getting flu vaccine today at work  Lab Results  Component Value Date   CREATININE 0.83 12/29/2018   BUN 7 12/29/2018   NA 138 12/29/2018   K 4.0 12/29/2018   CL 100 12/29/2018   CO2 22 12/29/2018     Depression screen PHQ 2/9 02/09/2019 12/28/2018 11/10/2018  Decreased Interest 0 0 0  Down, Depressed, Hopeless 0 0 0  PHQ - 2 Score 0 0 0  Altered sleeping - - -  Tired, decreased energy - - -  Change in appetite - - -  Feeling bad or failure about yourself  - - -  Trouble concentrating - - -  Moving slowly or fidgety/restless - - -  Suicidal thoughts - - -  PHQ-9 Score - - -  Difficult doing work/chores - - -    Fall Risk  02/09/2019 12/28/2018 11/10/2018 06/24/2018 03/24/2018  Falls in the past year? 0 0 0 0 0  Number falls in past yr: 0 - 0 0 -  Injury with Fall? 0 0 0 0 -     No Known Allergies  Prior to Admission medications   Medication Sig Start Date End Date Taking? Authorizing Provider  cetirizine (ZYRTEC) 10 MG tablet Take 10 mg by mouth daily.    Yes [provider]  DULoxetine (CYMBALTA) 60 MG capsule Take 1 capsule (60 mg total) by mouth daily. 10/07/18  Yes 12/07/18, MD  fluticasone (FLONASE) 50 MCG/ACT nasal spray Place 1  spray into both nostrils 2 (two) times daily. Patient taking differently: Place 1 spray into both nostrils 2 (two) times daily as needed for allergies.  09/03/17  Yes 11/03/17, MD  hydrochlorothiazide (HYDRODIURIL) 25 MG tablet TAKE ONE TABLET BY MOUTH DAILY 12/23/18  Yes 12/25/18, MD  losartan (COZAAR) 50 MG tablet Take 1 tablet (50 mg total) by mouth daily. 06/24/18  Yes 06/26/18, MD  prazosin (MINIPRESS) 1 MG capsule TAKE ONE CAPSULE BY MOUTH TWICE A DAY 12/24/18  Yes 12/26/18, MD    Past Medical History:  Diagnosis Date  . Anxiety   . Chronic headaches   . Depression   . Hyperlipidemia   . Hypertension   . PTSD (post-traumatic stress disorder) 2007    History reviewed. No pertinent surgical history.  Social History   Tobacco Use  . Smoking status: Former Smoker    Quit date: 05/06/1998    Years since quitting: 20.7  . Smokeless tobacco: Never Used  Substance Use Topics  . Alcohol use: Never    Frequency: Never    Family History  Problem Relation Age of Onset  . Healthy Mother   . Diverticulitis  Mother   . Hypertension Father   . Hyperlipidemia Father   . Healthy Sister   . Healthy Brother   . Stomach cancer Paternal Aunt     Review of Systems  Constitutional: Negative for chills and fever.  Respiratory: Negative for cough and shortness of breath.   Cardiovascular: Negative for chest pain, palpitations and leg swelling.  Gastrointestinal: Negative for abdominal pain, nausea and vomiting.     OBJECTIVE:  Today's Vitals   02/09/19 0902  BP: 135/87  Pulse: 92  Temp: 99.1 F (37.3 C)  SpO2: 96%  Weight: 242 lb (109.8 kg)  Height: 5\' 6"  (1.676 m)   Body mass index is 39.06 kg/m.   Physical Exam Vitals signs and nursing note reviewed.  Constitutional:      Appearance: She is well-developed.  HENT:     Head: Normocephalic and atraumatic.  Eyes:     General: No scleral icterus.    Conjunctiva/sclera: Conjunctivae normal.      Pupils: Pupils are equal, round, and reactive to light.  Neck:     Musculoskeletal: Neck supple.  Pulmonary:     Effort: Pulmonary effort is normal.  Skin:    General: Skin is warm and dry.  Neurological:     Mental Status: She is alert and oriented to person, place, and time.     No results found for this or any previous visit (from the past 24 hour(s)).  No results found.   ASSESSMENT and PLAN  1. Essential hypertension, benign Discussed importance of weight loss for bp to normalize. Discussed at length LFM. Calories 1600 a day ~ 1 lbs a week. Home bp monitoring.  2. PTSD (post-traumatic stress disorder) Controlled. Continue current regime.   3. BMI 39.0-39.9,adult See #1  Return in about 3 months (around 05/12/2019).    Rutherford Guys, MD Primary Care at [facility] [place], [place] [address] Ph.  [phone] Fax [phone]

## 2019-02-16 ENCOUNTER — Ambulatory Visit (INDEPENDENT_AMBULATORY_CARE_PROVIDER_SITE_OTHER): Payer: 59 | Admitting: Psychology

## 2019-02-16 DIAGNOSIS — F330 Major depressive disorder, recurrent, mild: Secondary | ICD-10-CM | POA: Diagnosis not present

## 2019-02-16 DIAGNOSIS — F4310 Post-traumatic stress disorder, unspecified: Secondary | ICD-10-CM

## 2019-02-16 DIAGNOSIS — F4322 Adjustment disorder with anxiety: Secondary | ICD-10-CM

## 2019-03-02 ENCOUNTER — Ambulatory Visit (INDEPENDENT_AMBULATORY_CARE_PROVIDER_SITE_OTHER): Payer: 59 | Admitting: Psychology

## 2019-03-02 DIAGNOSIS — F4322 Adjustment disorder with anxiety: Secondary | ICD-10-CM | POA: Diagnosis not present

## 2019-03-02 DIAGNOSIS — F4310 Post-traumatic stress disorder, unspecified: Secondary | ICD-10-CM | POA: Diagnosis not present

## 2019-03-02 DIAGNOSIS — F331 Major depressive disorder, recurrent, moderate: Secondary | ICD-10-CM

## 2019-03-17 ENCOUNTER — Ambulatory Visit (INDEPENDENT_AMBULATORY_CARE_PROVIDER_SITE_OTHER): Payer: 59 | Admitting: Psychology

## 2019-03-17 DIAGNOSIS — F4310 Post-traumatic stress disorder, unspecified: Secondary | ICD-10-CM | POA: Diagnosis not present

## 2019-03-17 DIAGNOSIS — F4322 Adjustment disorder with anxiety: Secondary | ICD-10-CM

## 2019-03-17 DIAGNOSIS — F330 Major depressive disorder, recurrent, mild: Secondary | ICD-10-CM | POA: Diagnosis not present

## 2019-03-22 ENCOUNTER — Other Ambulatory Visit: Payer: Self-pay | Admitting: Family Medicine

## 2019-03-31 ENCOUNTER — Ambulatory Visit (INDEPENDENT_AMBULATORY_CARE_PROVIDER_SITE_OTHER): Payer: 59 | Admitting: Psychology

## 2019-03-31 DIAGNOSIS — F4310 Post-traumatic stress disorder, unspecified: Secondary | ICD-10-CM | POA: Diagnosis not present

## 2019-03-31 DIAGNOSIS — F330 Major depressive disorder, recurrent, mild: Secondary | ICD-10-CM | POA: Diagnosis not present

## 2019-03-31 DIAGNOSIS — F4322 Adjustment disorder with anxiety: Secondary | ICD-10-CM | POA: Diagnosis not present

## 2019-04-05 DIAGNOSIS — M25562 Pain in left knee: Secondary | ICD-10-CM | POA: Insufficient documentation

## 2019-04-13 ENCOUNTER — Ambulatory Visit (INDEPENDENT_AMBULATORY_CARE_PROVIDER_SITE_OTHER): Payer: 59 | Admitting: Psychology

## 2019-04-13 DIAGNOSIS — F4310 Post-traumatic stress disorder, unspecified: Secondary | ICD-10-CM

## 2019-04-13 DIAGNOSIS — F330 Major depressive disorder, recurrent, mild: Secondary | ICD-10-CM

## 2019-04-13 DIAGNOSIS — F4322 Adjustment disorder with anxiety: Secondary | ICD-10-CM | POA: Diagnosis not present

## 2019-04-19 DIAGNOSIS — S8992XA Unspecified injury of left lower leg, initial encounter: Secondary | ICD-10-CM | POA: Insufficient documentation

## 2019-04-22 ENCOUNTER — Other Ambulatory Visit: Payer: Self-pay | Admitting: Family Medicine

## 2019-04-27 ENCOUNTER — Ambulatory Visit (INDEPENDENT_AMBULATORY_CARE_PROVIDER_SITE_OTHER): Payer: 59 | Admitting: Psychology

## 2019-04-27 DIAGNOSIS — F4322 Adjustment disorder with anxiety: Secondary | ICD-10-CM | POA: Diagnosis not present

## 2019-04-27 DIAGNOSIS — F4310 Post-traumatic stress disorder, unspecified: Secondary | ICD-10-CM

## 2019-04-27 DIAGNOSIS — F330 Major depressive disorder, recurrent, mild: Secondary | ICD-10-CM

## 2019-05-13 ENCOUNTER — Ambulatory Visit: Payer: 59 | Admitting: Psychology

## 2019-05-13 ENCOUNTER — Ambulatory Visit: Payer: 59 | Admitting: Family Medicine

## 2019-05-21 ENCOUNTER — Encounter: Payer: Self-pay | Admitting: Family Medicine

## 2019-05-21 ENCOUNTER — Ambulatory Visit: Payer: 59 | Admitting: Family Medicine

## 2019-05-21 ENCOUNTER — Other Ambulatory Visit: Payer: Self-pay

## 2019-05-21 VITALS — BP 138/86 | HR 97 | Temp 98.5°F | Ht 66.0 in | Wt 245.0 lb

## 2019-05-21 DIAGNOSIS — F3341 Major depressive disorder, recurrent, in partial remission: Secondary | ICD-10-CM

## 2019-05-21 DIAGNOSIS — R0789 Other chest pain: Secondary | ICD-10-CM

## 2019-05-21 MED ORDER — MELOXICAM 15 MG PO TABS: 15.0000 mg | ORAL_TABLET | Freq: Every day | ORAL | 0 refills | Status: DC

## 2019-05-21 MED ORDER — BUPROPION HCL ER (SR) 100 MG PO TB12: 100.0000 mg | ORAL_TABLET | Freq: Every day | ORAL | 2 refills | Status: DC

## 2019-05-21 MED ORDER — CYCLOBENZAPRINE HCL 10 MG PO TABS: 10.0000 mg | ORAL_TABLET | Freq: Three times a day (TID) | ORAL | 0 refills | Status: DC | PRN

## 2019-05-21 NOTE — Patient Instructions (Signed)
° ° ° °  If you have lab work done today you will be contacted with your lab results within the next 2 weeks.  If you have not heard from us then please contact us. The fastest way to get your results is to register for My Chart. ° ° °IF you received an x-ray today, you will receive an invoice from [facility]. Please contact  Radiology at [phone] with questions or concerns regarding your invoice.  ° °IF you received labwork today, you will receive an invoice from [facility]. Please contact [facility] at [phone] with questions or concerns regarding your invoice.  ° °Our billing staff will not be able to assist you with questions regarding bills from these companies. ° °You will be contacted with the lab results as soon as they are available. The fastest way to get your results is to activate your My Chart account. Instructions are located on the last page of this paperwork. If you have not heard from us regarding the results in 2 weeks, please contact this office. °  ° ° ° °

## 2019-05-21 NOTE — Progress Notes (Signed)
1/15/202112:14 PM  Maria Morse [birth year], 44 y.o., female [id number]  Chief Complaint  Patient presents with  . Follow-up    htn, weightloss, taking kick boxing hasa fall. now in pt for left knee    HPI:   Patient is a 44 y.o. female with past medical history significant for HTN, chronic fatigue, PTSD and vit D deficiencywho presents today for routine followup  Last OV oct 2020  Had a slip and fall day after thanksgiving Left knee pain, doing PT [facility] Having pandemic fatigue Still doing counseling Worried about her 13yo dog - currently at the vet phq9 noted  Has really been enjoying kick boxing Doing 30 minutes 2-3 times a week Currently on hold due to left knee  Having upper left side chest wall pain for several weeks Feels tender to touch Worse when she moves her arm Thinks it is related to how she sleeps Has not tried anything for this   Depression screen [facility] 2/9 05/21/2019 02/09/2019 12/28/2018  Decreased Interest 1 0 0  Down, Depressed, Hopeless 2 0 0  PHQ - 2 Score 3 0 0  Altered sleeping 2 - -  Tired, decreased energy 1 - -  Change in appetite 1 - -  Feeling bad or failure about yourself  2 - -  Trouble concentrating 3 - -  Moving slowly or fidgety/restless 2 - -  Suicidal thoughts 0 - -  PHQ-9 Score 14 - -  Difficult doing work/chores - - -    Fall Risk  02/09/2019 12/28/2018 11/10/2018 06/24/2018 03/24/2018  Falls in the past year? 0 0 0 0 0  Number falls in past yr: 0 - 0 0 -  Injury with Fall? 0 0 0 0 -     No Known Allergies  Prior to Admission medications   Medication Sig Start Date End Date Taking? Authorizing Provider  cetirizine (ZYRTEC) 10 MG tablet Take 10 mg by mouth daily.     [provider]  DULoxetine (CYMBALTA) 60 MG capsule TAKE ONE CAPSULE BY MOUTH DAILY 04/22/19   Myles Lipps, MD  fluticasone [facility]) 50 MCG/ACT nasal spray Place 1 spray into both nostrils 2 (two) times daily. Patient taking  differently: Place 1 spray into both nostrils 2 (two) times daily as needed for allergies.  09/03/17   Myles Lipps, MD  hydrochlorothiazide (HYDRODIURIL) 25 MG tablet TAKE ONE TABLET BY MOUTH DAILY 12/23/18   Myles Lipps, MD  losartan (COZAAR) 50 MG tablet TAKE ONE TABLET BY MOUTH DAILY 04/22/19   Myles Lipps, MD  prazosin (MINIPRESS) 1 MG capsule TAKE ONE CAPSULE BY MOUTH TWICE A DAY 04/22/19   Myles Lipps, MD    Past Medical History:  Diagnosis Date  . Anxiety   . Chronic headaches   . Depression   . Hyperlipidemia   . Hypertension   . PTSD (post-traumatic stress disorder) 2007    History reviewed. No pertinent surgical history.  Social History   Tobacco Use  . Smoking status: Former Smoker    Quit date: 05/06/1998    Years since quitting: 21.0  . Smokeless tobacco: Never Used  Substance Use Topics  . Alcohol use: Never    Family History  Problem Relation Age of Onset  . Healthy Mother   . Diverticulitis Mother   . Hypertension Father   . Hyperlipidemia Father   . Healthy Sister   . Healthy Brother   . Stomach cancer Paternal Aunt  ROS Per hpi  OBJECTIVE:  Today's Vitals   05/21/19 1210  BP: 138/86  Pulse: 97  Temp: 98.5 F (36.9 C)  SpO2: 97%  Weight: 245 lb (111.1 kg)  Height: 5\' 6"  (1.676 m)   Body mass index is 39.54 kg/m.  Wt Readings from Last 3 Encounters:  05/21/19 245 lb (111.1 kg)  02/09/19 242 lb (109.8 kg)  01/05/19 247 lb (112 kg)    Physical Exam Vitals and nursing note reviewed.  Constitutional:      Appearance: She is well-developed.  HENT:     Head: Normocephalic and atraumatic.  Eyes:     General: No scleral icterus.    Conjunctiva/sclera: Conjunctivae normal.     Pupils: Pupils are equal, round, and reactive to light.  Pulmonary:     Effort: Pulmonary effort is normal.  Chest:     Chest wall: Swelling (left pectoralis) and tenderness present. No deformity or crepitus.    Musculoskeletal:      Cervical back: Neck supple.  Skin:    General: Skin is warm and dry.  Neurological:     Mental Status: She is alert and oriented to person, place, and time.     No results found for this or any previous visit (from the past 24 hour(s)).  No results found.   ASSESSMENT and PLAN  1. Recurrent major depressive disorder, in partial remission (HCC) Uncontrolled. Augmenting with wellbutrin, reviewed r/se/b. Cont with counseling and exercise.  2. Left-sided chest wall pain Discussed supportive measures, new meds r/se/b and RTC precautions.   Other orders - buPROPion (WELLBUTRIN SR) 100 MG 12 hr tablet; Take 1 tablet (100 mg total) by mouth daily. - meloxicam (MOBIC) 15 MG tablet; Take 1 tablet (15 mg total) by mouth daily. - cyclobenzaprine (FLEXERIL) 10 MG tablet; Take 1 tablet (10 mg total) by mouth 3 (three) times daily as needed for muscle spasms.  No follow-ups on file.    Rutherford Guys, MD Primary Care at [place], [place] [address] Ph.  [phone] Fax [phone]

## 2019-05-22 ENCOUNTER — Other Ambulatory Visit: Payer: Self-pay | Admitting: Family Medicine

## 2019-05-25 ENCOUNTER — Ambulatory Visit (INDEPENDENT_AMBULATORY_CARE_PROVIDER_SITE_OTHER): Payer: 59 | Admitting: Psychology

## 2019-05-25 DIAGNOSIS — F330 Major depressive disorder, recurrent, mild: Secondary | ICD-10-CM

## 2019-05-25 DIAGNOSIS — F4322 Adjustment disorder with anxiety: Secondary | ICD-10-CM | POA: Diagnosis not present

## 2019-05-25 DIAGNOSIS — F4310 Post-traumatic stress disorder, unspecified: Secondary | ICD-10-CM | POA: Diagnosis not present

## 2019-06-08 ENCOUNTER — Ambulatory Visit (INDEPENDENT_AMBULATORY_CARE_PROVIDER_SITE_OTHER): Payer: 59 | Admitting: Psychology

## 2019-06-08 DIAGNOSIS — F330 Major depressive disorder, recurrent, mild: Secondary | ICD-10-CM | POA: Diagnosis not present

## 2019-06-08 DIAGNOSIS — F4310 Post-traumatic stress disorder, unspecified: Secondary | ICD-10-CM | POA: Diagnosis not present

## 2019-06-08 DIAGNOSIS — F4322 Adjustment disorder with anxiety: Secondary | ICD-10-CM

## 2019-06-19 ENCOUNTER — Other Ambulatory Visit: Payer: Self-pay | Admitting: Family Medicine

## 2019-06-19 NOTE — Telephone Encounter (Signed)
Requested Prescriptions  Pending Prescriptions Disp Refills  . hydrochlorothiazide (HYDRODIURIL) 25 MG tablet [Pharmacy Med Name: hydroCHLOROthiazide 25 MG TABLET] 30 tablet 0    Sig: TAKE ONE TABLET BY MOUTH DAILY     Cardiovascular: Diuretics - Thiazide Passed - 06/19/2019 10:50 AM      Passed - Ca in normal range and within 360 days    Calcium  Date Value Ref Range Status  12/29/2018 9.2 8.7 - 10.2 mg/dL Final         Passed - Cr in normal range and within 360 days    Creatinine, Ser  Date Value Ref Range Status  12/29/2018 0.83 0.57 - 1.00 mg/dL Final         Passed - K in normal range and within 360 days    Potassium  Date Value Ref Range Status  12/29/2018 4.0 3.5 - 5.2 mmol/L Final         Passed - Na in normal range and within 360 days    Sodium  Date Value Ref Range Status  12/29/2018 138 134 - 144 mmol/L Final         Passed - Last BP in normal range    BP Readings from Last 1 Encounters:  05/21/19 138/86         Passed - Valid encounter within last 6 months    Recent Outpatient Visits          4 weeks ago Recurrent major depressive disorder, in partial remission (HCC)   [facility], Meda Coffee, MD   4 months ago Essential hypertension, benign   [facility], Meda Coffee, MD   5 months ago Fatigue, unspecified type   [facility], Meda Coffee, MD   5 months ago Acute epigastric pain   [facility], Gerlene Burdock, NP   7 months ago PTSD (post-traumatic stress disorder)   [facility], Meda Coffee, MD      Future Appointments            In 5 days Myles Lipps, MD [facility] [place]

## 2019-06-21 ENCOUNTER — Other Ambulatory Visit: Payer: Self-pay | Admitting: Family Medicine

## 2019-06-22 ENCOUNTER — Ambulatory Visit (INDEPENDENT_AMBULATORY_CARE_PROVIDER_SITE_OTHER): Payer: 59 | Admitting: Psychology

## 2019-06-22 ENCOUNTER — Other Ambulatory Visit: Payer: Self-pay | Admitting: Family Medicine

## 2019-06-22 DIAGNOSIS — F330 Major depressive disorder, recurrent, mild: Secondary | ICD-10-CM

## 2019-06-22 DIAGNOSIS — F4310 Post-traumatic stress disorder, unspecified: Secondary | ICD-10-CM

## 2019-06-22 DIAGNOSIS — F4322 Adjustment disorder with anxiety: Secondary | ICD-10-CM

## 2019-06-24 ENCOUNTER — Other Ambulatory Visit: Payer: Self-pay

## 2019-06-24 ENCOUNTER — Telehealth (INDEPENDENT_AMBULATORY_CARE_PROVIDER_SITE_OTHER): Payer: 59 | Admitting: Family Medicine

## 2019-06-24 DIAGNOSIS — K644 Residual hemorrhoidal skin tags: Secondary | ICD-10-CM

## 2019-06-24 DIAGNOSIS — F3341 Major depressive disorder, recurrent, in partial remission: Secondary | ICD-10-CM | POA: Diagnosis not present

## 2019-06-24 MED ORDER — HYDROCORTISONE (PERIANAL) 2.5 % EX CREA: 1.0000 "application " | TOPICAL_CREAM | Freq: Two times a day (BID) | CUTANEOUS | 2 refills | Status: AC

## 2019-06-24 NOTE — Progress Notes (Signed)
Pt is following up on the wellbutrin medication. Says she understands she has not been on the medication long enough to really feel a difference, but she does feel "ok". Other concern, beleives she may have a hemorrhoid. This is a sparatic occurrance with her. She is using preparation H, does feel some improvement. Feels inflammation, protrusion and swelling. No problem with elimination or bleeding from the rectum. Ph9 9 completed.

## 2019-06-24 NOTE — Progress Notes (Signed)
Virtual Visit Note  I connected with patient on 06/24/19 at 131pm by video doximity and verified that I am speaking with the correct person using two identifiers. Maria Morse is currently located at home and patient is currently with them during visit. The provider, Myles Lipps, MD is located in their office at time of visit.  I discussed the limitations, risks, security and privacy concerns of performing an evaluation and management service by telephone and the availability of in person appointments. I also discussed with the patient that there may be a patient responsible charge related to this service. The patient expressed understanding and agreed to proceed.   CC: followup on depression  HPI ? PMH: HTN, chronic fatigue, depression, PTSD and vit D deficiency  Last OV a month ago - started wellbutrin SR 100mg  daily Tolerating well Has not noticed any significant changes except for past 2 days when she has felt "good" PHQ9 noted  Having a hemorrhoid flare up has been using prep H, having some irritation and swelling, no bleeding or pain  Depression screen [facility] 2/9 06/24/2019 05/21/2019 02/09/2019  Decreased Interest 2 1 0  Down, Depressed, Hopeless 2 2 0  PHQ - 2 Score 4 3 0  Altered sleeping 1 2 -  Tired, decreased energy 2 1 -  Change in appetite 2 1 -  Feeling bad or failure about yourself  2 2 -  Trouble concentrating 2 3 -  Moving slowly or fidgety/restless 2 2 -  Suicidal thoughts 0 0 -  PHQ-9 Score 15 14 -  Difficult doing work/chores - - -     No Known Allergies  Prior to Admission medications   Medication Sig Start Date End Date Taking? Authorizing Provider  buPROPion (WELLBUTRIN SR) 100 MG 12 hr tablet Take 1 tablet (100 mg total) by mouth daily. 05/21/19  Yes 05/23/19, MD  cetirizine (ZYRTEC) 10 MG tablet Take 10 mg by mouth daily.    Yes [provider]  cyclobenzaprine (FLEXERIL) 10 MG tablet Take 1 tablet (10 mg total) by mouth 3  (three) times daily as needed for muscle spasms. 05/21/19  Yes 05/23/19, MD  DULoxetine (CYMBALTA) 60 MG capsule TAKE ONE CAPSULE BY MOUTH DAILY 05/23/19  Yes 05/25/19, MD  fluticasone [facility]) 50 MCG/ACT nasal spray Place 1 spray into both nostrils 2 (two) times daily. Patient taking differently: Place 1 spray into both nostrils 2 (two) times daily as needed for allergies.  09/03/17  Yes 11/03/17, MD  hydrochlorothiazide (HYDRODIURIL) 25 MG tablet TAKE ONE TABLET BY MOUTH DAILY 06/19/19  Yes 06/21/19, MD  losartan (COZAAR) 50 MG tablet TAKE ONE TABLET BY MOUTH DAILY 05/23/19  Yes 05/25/19, MD  meloxicam (MOBIC) 15 MG tablet Take 1 tablet (15 mg total) by mouth daily. 05/21/19  Yes 05/23/19, MD  prazosin (MINIPRESS) 1 MG capsule TAKE ONE CAPSULE BY MOUTH TWICE A DAY 05/23/19  Yes 05/25/19, MD    Past Medical History:  Diagnosis Date  . Anxiety   . Chronic headaches   . Depression   . Hyperlipidemia   . Hypertension   . PTSD (post-traumatic stress disorder) 2007    No past surgical history on file.  Social History   Tobacco Use  . Smoking status: Former Smoker    Quit date: 05/06/1998    Years since quitting: 21.1  . Smokeless tobacco: Never Used  Substance Use Topics  . Alcohol  use: Never    Family History  Problem Relation Age of Onset  . Healthy Mother   . Diverticulitis Mother   . Hypertension Father   . Hyperlipidemia Father   . Healthy Sister   . Healthy Brother   . Stomach cancer Paternal Aunt     ROS Per hpi  Objective  Vitals as reported by the patient: none  GEN: AAOx3, NAD HEENT: [place]/AT, pupils are symmetrical, EOMI, non-icteric sclera Resp: breathing comfortably, speaking in full sentences Skin: no rashes noted, no pallor Psych: good eye contact, normal mood and affect   ASSESSMENT and PLAN  1. Recurrent major depressive disorder, in partial remission ([place]) Stable with improvement in past 2 days. Will  cont current regime for now, patient to reach out in 2-4 weeks if no sign improvement in order to increase wellbutrin.   2. External hemorrhoid Discussed supportive measures, new meds r/se/b and RTC precautions.   Other orders - hydrocortisone (PROCTOSOL HC) 2.5 % rectal cream; Place 1 application rectally 2 (two) times daily.  FOLLOW-UP: 3 months   The above assessment and management plan was discussed with the patient. The patient verbalized understanding of and has agreed to the management plan. Patient is aware to call the clinic if symptoms persist or worsen. Patient is aware when to return to the clinic for a follow-up visit. Patient educated on when it is appropriate to go to the emergency department.    I provided 16 minutes of non-face-to-face time during this encounter.  Rutherford Guys, MD Primary Care at [facility] [place], [place] [address] Ph.  [phone] Fax [phone]

## 2019-07-07 ENCOUNTER — Ambulatory Visit (INDEPENDENT_AMBULATORY_CARE_PROVIDER_SITE_OTHER): Payer: 59 | Admitting: Psychology

## 2019-07-07 DIAGNOSIS — F4322 Adjustment disorder with anxiety: Secondary | ICD-10-CM | POA: Diagnosis not present

## 2019-07-07 DIAGNOSIS — F4310 Post-traumatic stress disorder, unspecified: Secondary | ICD-10-CM

## 2019-07-07 DIAGNOSIS — F330 Major depressive disorder, recurrent, mild: Secondary | ICD-10-CM

## 2019-07-13 ENCOUNTER — Ambulatory Visit: Payer: 59 | Admitting: Psychology

## 2019-07-19 ENCOUNTER — Other Ambulatory Visit: Payer: Self-pay | Admitting: Family Medicine

## 2019-07-19 ENCOUNTER — Ambulatory Visit (INDEPENDENT_AMBULATORY_CARE_PROVIDER_SITE_OTHER): Payer: 59 | Admitting: Psychology

## 2019-07-19 DIAGNOSIS — F330 Major depressive disorder, recurrent, mild: Secondary | ICD-10-CM

## 2019-07-19 DIAGNOSIS — F4322 Adjustment disorder with anxiety: Secondary | ICD-10-CM | POA: Diagnosis not present

## 2019-07-19 DIAGNOSIS — F4310 Post-traumatic stress disorder, unspecified: Secondary | ICD-10-CM | POA: Diagnosis not present

## 2019-07-29 ENCOUNTER — Ambulatory Visit (INDEPENDENT_AMBULATORY_CARE_PROVIDER_SITE_OTHER): Payer: 59 | Admitting: Psychology

## 2019-07-29 DIAGNOSIS — F4310 Post-traumatic stress disorder, unspecified: Secondary | ICD-10-CM | POA: Diagnosis not present

## 2019-07-29 DIAGNOSIS — F330 Major depressive disorder, recurrent, mild: Secondary | ICD-10-CM

## 2019-07-29 DIAGNOSIS — F4322 Adjustment disorder with anxiety: Secondary | ICD-10-CM

## 2019-08-11 ENCOUNTER — Ambulatory Visit: Payer: 59 | Admitting: Psychology

## 2019-08-18 ENCOUNTER — Other Ambulatory Visit: Payer: Self-pay | Admitting: Family Medicine

## 2019-08-18 NOTE — Telephone Encounter (Signed)
Requested Prescriptions  Pending Prescriptions Disp Refills  . losartan (COZAAR) 50 MG tablet [Pharmacy Med Name: LOSARTAN POTASSIUM 50 MG TAB] 30 tablet 1    Sig: TAKE ONE TABLET BY MOUTH DAILY     Cardiovascular:  Angiotensin Receptor Blockers Failed - 08/18/2019 11:10 AM      Failed - Cr in normal range and within 180 days    Creatinine, Ser  Date Value Ref Range Status  12/29/2018 0.83 0.57 - 1.00 mg/dL Final         Failed - K in normal range and within 180 days    Potassium  Date Value Ref Range Status  12/29/2018 4.0 3.5 - 5.2 mmol/L Final         Passed - Patient is not pregnant      Passed - Last BP in normal range    BP Readings from Last 1 Encounters:  05/21/19 138/86         Passed - Valid encounter within last 6 months    Recent Outpatient Visits          1 month ago Recurrent major depressive disorder, in partial remission (HCC)   [facility], Meda Coffee, MD   2 months ago Recurrent major depressive disorder, in partial remission [facility])   [facility], Meda Coffee, MD   6 months ago Essential hypertension, benign   [facility], Meda Coffee, MD   7 months ago Fatigue, unspecified type   [facility], Meda Coffee, MD   7 months ago Acute epigastric pain   [facility], Richard, NP             . buPROPion (WELLBUTRIN SR) 100 MG 12 hr tablet [Pharmacy Med Name: buPROPion HCL SR 100 MG TABLET] 30 tablet 1    Sig: TAKE ONE TABLET BY MOUTH DAILY     Psychiatry: Antidepressants - bupropion Passed - 08/18/2019 11:10 AM      Passed - Completed PHQ-2 or PHQ-9 in the last 360 days.      Passed - Last BP in normal range    BP Readings from Last 1 Encounters:  05/21/19 138/86         Passed - Valid encounter within last 6 months    Recent Outpatient Visits          1 month ago Recurrent major depressive disorder, in partial remission [facility])   [facility], Meda Coffee, MD    2 months ago Recurrent major depressive disorder, in partial remission [facility])   [facility], Meda Coffee, MD   6 months ago Essential hypertension, benign   [facility], Meda Coffee, MD   7 months ago Fatigue, unspecified type   [facility], Meda Coffee, MD   7 months ago Acute epigastric pain   [facility], Gerlene Burdock, NP             . DULoxetine (CYMBALTA) 60 MG capsule [Pharmacy Med Name: DULOXETINE HCL DR 60 MG CAPSULE] 30 capsule 1    Sig: TAKE ONE CAPSULE BY MOUTH DAILY     Psychiatry: Antidepressants - SNRI Passed - 08/18/2019 11:10 AM      Passed - Completed PHQ-2 or PHQ-9 in the last 360 days.      Passed - Last BP in normal range    BP Readings from Last 1 Encounters:  05/21/19 138/86         Passed - Valid encounter within last 6 months    Recent Outpatient Visits          1 month ago Recurrent major depressive disorder, in partial remission ([place])   [facility], Lilia Argue, MD   2 months ago Recurrent major depressive disorder, in partial remission [facility])   [facility], Lilia Argue, MD   6 months ago Essential hypertension, benign   [facility], Lilia Argue, MD   7 months ago Fatigue, unspecified type   [facility], Lilia Argue, MD   7 months ago Acute epigastric pain   [facility], [place], NP             . prazosin (MINIPRESS) 1 MG capsule [Pharmacy Med Name: PRAZOSIN 1 MG CAPSULE] 60 capsule 1    Sig: TAKE ONE CAPSULE BY MOUTH TWICE A DAY     Cardiovascular:  Alpha Blockers Passed - 08/18/2019 11:10 AM      Passed - Last BP in normal range    BP Readings from Last 1 Encounters:  05/21/19 138/86         Passed - Valid encounter within last 6 months    Recent Outpatient Visits          1 month ago Recurrent major depressive disorder, in partial remission ([place])   [facility], Lilia Argue, MD   2  months ago Recurrent major depressive disorder, in partial remission [facility])   [facility], Lilia Argue, MD   6 months ago Essential hypertension, benign   [facility], Lilia Argue, MD   7 months ago Fatigue, unspecified type   [facility], Lilia Argue, MD   7 months ago Acute epigastric pain   [facility], Delfino Lovett, NP

## 2019-08-30 ENCOUNTER — Ambulatory Visit: Payer: 59 | Admitting: Psychology

## 2019-09-02 ENCOUNTER — Ambulatory Visit (INDEPENDENT_AMBULATORY_CARE_PROVIDER_SITE_OTHER): Payer: 59 | Admitting: Psychology

## 2019-09-02 DIAGNOSIS — F4310 Post-traumatic stress disorder, unspecified: Secondary | ICD-10-CM | POA: Diagnosis not present

## 2019-09-02 DIAGNOSIS — F4322 Adjustment disorder with anxiety: Secondary | ICD-10-CM

## 2019-09-02 DIAGNOSIS — F330 Major depressive disorder, recurrent, mild: Secondary | ICD-10-CM

## 2019-09-13 ENCOUNTER — Ambulatory Visit (INDEPENDENT_AMBULATORY_CARE_PROVIDER_SITE_OTHER): Payer: 59 | Admitting: Psychology

## 2019-09-13 DIAGNOSIS — F4322 Adjustment disorder with anxiety: Secondary | ICD-10-CM | POA: Diagnosis not present

## 2019-09-13 DIAGNOSIS — F4310 Post-traumatic stress disorder, unspecified: Secondary | ICD-10-CM

## 2019-09-13 DIAGNOSIS — F330 Major depressive disorder, recurrent, mild: Secondary | ICD-10-CM

## 2019-09-16 ENCOUNTER — Encounter: Payer: Self-pay | Admitting: Family Medicine

## 2019-09-16 ENCOUNTER — Ambulatory Visit (INDEPENDENT_AMBULATORY_CARE_PROVIDER_SITE_OTHER): Payer: 59 | Admitting: Psychology

## 2019-09-16 DIAGNOSIS — F4310 Post-traumatic stress disorder, unspecified: Secondary | ICD-10-CM

## 2019-09-16 DIAGNOSIS — F330 Major depressive disorder, recurrent, mild: Secondary | ICD-10-CM | POA: Diagnosis not present

## 2019-09-16 DIAGNOSIS — F4322 Adjustment disorder with anxiety: Secondary | ICD-10-CM | POA: Diagnosis not present

## 2019-09-17 ENCOUNTER — Telehealth (INDEPENDENT_AMBULATORY_CARE_PROVIDER_SITE_OTHER): Payer: 59 | Admitting: Family Medicine

## 2019-09-17 ENCOUNTER — Encounter: Payer: Self-pay | Admitting: Family Medicine

## 2019-09-17 DIAGNOSIS — Z566 Other physical and mental strain related to work: Secondary | ICD-10-CM

## 2019-09-17 DIAGNOSIS — F411 Generalized anxiety disorder: Secondary | ICD-10-CM | POA: Diagnosis not present

## 2019-09-17 DIAGNOSIS — F332 Major depressive disorder, recurrent severe without psychotic features: Secondary | ICD-10-CM | POA: Diagnosis not present

## 2019-09-17 MED ORDER — BUPROPION HCL ER (XL) 300 MG PO TB24: 300.0000 mg | ORAL_TABLET | Freq: Every day | ORAL | 2 refills | Status: DC

## 2019-09-17 MED ORDER — ALPRAZOLAM 0.5 MG PO TABS: 0.5000 mg | ORAL_TABLET | Freq: Two times a day (BID) | ORAL | 0 refills | Status: DC | PRN

## 2019-09-17 NOTE — Progress Notes (Signed)
Virtual Visit Note  I connected with patient on 09/17/19 at 501pm by video epic and verified that I am speaking with the correct person using two identifiers. Maria Morse is currently located at home and patient is currently with them during visit. The provider, Rutherford Guys, MD is located in their office at time of visit.  I discussed the limitations, risks, security and privacy concerns of performing an evaluation and management service by telephone and the availability of in person appointments. I also discussed with the patient that there may be a patient responsible charge related to this service. The patient expressed understanding and agreed to proceed.   I provided 27 minutes of non-face-to-face time during this encounter.  Chief Complaint  Patient presents with  . Depression    HPI ? PMH: HTN, chronic fatigue, depression, PTSD and vit D deficiency  Last OV feb 2021 - no changes  This past week getting worsening anxiety, overwhelmed, fatigue Had a meeting at work that became the pinnacle of "me being done" She was called out "what is with the face" in front of everyone Spoke with her therapist yesterday, has been working with him for over a year, Lanny Hurst Cuddle Has tried to address other incidents with HR wo resolution HR called and informed her that she is now on paid admin leave with meeting early next week Cont to take cymbalta 60mg , wellbutrin 100mg  daily, prazosin 1mg  Over past several weeks sleep has been very poor Cant fall asleep, waking up several times No intrusive thoughts, no nightmares SI - passive, denies any active intent or plan  Depression screen [facility] 2/9 09/17/2019 06/24/2019 05/21/2019  Decreased Interest 3 2 1   Down, Depressed, Hopeless 3 2 2   PHQ - 2 Score 6 4 3   Altered sleeping 3 1 2   Tired, decreased energy 3 2 1   Change in appetite 3 2 1   Feeling bad or failure about yourself  3 2 2   Trouble concentrating 3 2 3   Moving slowly or  fidgety/restless 3 2 2   Suicidal thoughts 1 0 0  PHQ-9 Score 25 15 14   Difficult doing work/chores Extremely dIfficult - -   GAD 7 : Generalized Anxiety Score 09/17/2019 11/10/2018 11/10/2018 06/24/2018  Nervous, Anxious, on Edge 3 1 1 2   Control/stop worrying 3 1 1 2   Worry too much - different things 3 1 1 2   Trouble relaxing 3 1 1 1   Restless 3 1 0 0  Easily annoyed or irritable 3 1 1 2   Afraid - awful might happen 3 1 2 2   Total GAD 7 Score 21 7 7 11   Anxiety Difficulty Extremely difficult - Somewhat difficult Very difficult      No Known Allergies  Prior to Admission medications   Medication Sig Start Date End Date Taking? Authorizing Provider  buPROPion (WELLBUTRIN SR) 100 MG 12 hr tablet TAKE ONE TABLET BY MOUTH DAILY 08/18/19  Yes Rutherford Guys, MD  cetirizine (ZYRTEC) 10 MG tablet Take 10 mg by mouth daily.    Yes [provider]  cyclobenzaprine (FLEXERIL) 10 MG tablet Take 1 tablet (10 mg total) by mouth 3 (three) times daily as needed for muscle spasms. 05/21/19  Yes Rutherford Guys, MD  DULoxetine (CYMBALTA) 60 MG capsule TAKE ONE CAPSULE BY MOUTH DAILY 08/18/19  Yes Rutherford Guys, MD  fluticasone [facility]) 50 MCG/ACT nasal spray Place 1 spray into both nostrils 2 (two) times daily. Patient taking differently: Place 1 spray into both nostrils  2 (two) times daily as needed for allergies.  09/03/17  Yes Myles Lipps, MD  hydrochlorothiazide (HYDRODIURIL) 25 MG tablet TAKE ONE TABLET BY MOUTH DAILY 07/19/19  Yes Myles Lipps, MD  losartan (COZAAR) 50 MG tablet TAKE ONE TABLET BY MOUTH DAILY 08/18/19  Yes Myles Lipps, MD  meloxicam (MOBIC) 15 MG tablet Take 1 tablet (15 mg total) by mouth daily. 05/21/19  Yes Myles Lipps, MD  prazosin (MINIPRESS) 1 MG capsule TAKE ONE CAPSULE BY MOUTH TWICE A DAY 08/18/19  Yes Myles Lipps, MD  hydrocortisone (PROCTOSOL HC) 2.5 % rectal cream Place 1 application rectally 2 (two) times daily. Patient not taking:  Reported on 09/17/2019 06/24/19   Myles Lipps, MD    Past Medical History:  Diagnosis Date  . Anxiety   . Chronic headaches   . Depression   . Hyperlipidemia   . Hypertension   . PTSD (post-traumatic stress disorder) 2007    No past surgical history on file.  Social History   Tobacco Use  . Smoking status: Former Smoker    Quit date: 05/06/1998    Years since quitting: 21.3  . Smokeless tobacco: Never Used  Substance Use Topics  . Alcohol use: Never    Family History  Problem Relation Age of Onset  . Healthy Mother   . Diverticulitis Mother   . Hypertension Father   . Hyperlipidemia Father   . Healthy Sister   . Healthy Brother   . Stomach cancer Paternal Aunt     ROS Per hpi  Objective  Vitals as reported by the patient: none  GEN: AAOx3, NAD HEENT: [place]/AT, pupils are symmetrical, EOMI, non-icteric sclera Resp: breathing comfortably, speaking in full sentences Skin: no rashes noted, no pallor Psych: good eye contact, depressed mood, flat affect, normal speech   ASSESSMENT and PLAN  1. Severe episode of recurrent major depressive disorder, without psychotic features (HCC) 2. GAD (generalized anxiety disorder) 3. Stress at work Patient with sign worsening of depression prior recent work stressors, denies active SI, involved in counseling. Discussed treatment options, r/se/b. Decided to increase wellbutrin and xanax prn. Out of work, to be re-evaluated at next visit. Consider psychiatrist referral. ER precautions discussed.   Other orders - buPROPion (WELLBUTRIN XL) 300 MG 24 hr tablet; Take 1 tablet (300 mg total) by mouth daily. - ALPRAZolam (XANAX) 0.5 MG tablet; Take 1 tablet (0.5 mg total) by mouth 2 (two) times daily as needed for anxiety.  FOLLOW-UP: 1 week   The above assessment and management plan was discussed with the patient. The patient verbalized understanding of and has agreed to the management plan. Patient is aware to call the clinic if  symptoms persist or worsen. Patient is aware when to return to the clinic for a follow-up visit. Patient educated on when it is appropriate to go to the emergency department.     Myles Lipps, MD Primary Care at [facility] [address] [place], [place] [address] Ph.  [phone] Fax [phone]

## 2019-09-17 NOTE — Patient Instructions (Signed)
° ° ° °  If you have lab work done today you will be contacted with your lab results within the next 2 weeks.  If you have not heard from us then please contact us. The fastest way to get your results is to register for My Chart. ° ° °IF you received an x-ray today, you will receive an invoice from [facility]. Please contact [facility] at [phone] with questions or concerns regarding your invoice.  ° °IF you received labwork today, you will receive an invoice from [facility]. Please contact [facility] at [phone] with questions or concerns regarding your invoice.  ° °Our billing staff will not be able to assist you with questions regarding bills from these companies. ° °You will be contacted with the lab results as soon as they are available. The fastest way to get your results is to activate your My Chart account. Instructions are located on the last page of this paperwork. If you have not heard from us regarding the results in 2 weeks, please contact this office. °  ° ° ° °

## 2019-09-18 ENCOUNTER — Telehealth: Payer: Self-pay

## 2019-09-18 NOTE — Telephone Encounter (Signed)
Disp Refills Start End   ALPRAZolam (XANAX) 0.5 MG tablet 30 tablet 0 09/17/2019    Sig - Route: Take 1 tablet (0.5 mg total) by mouth 2 (two) times daily as needed for anxiety. - Oral   Sent to pharmacy as: ALPRAZolam Prudy Feeler) 0.5 MG tablet   E-Prescribing Status: Transmission to pharmacy failed (09/17/2019 5:56 PM EDT)    NT not allowed to resend controlled substance

## 2019-09-20 ENCOUNTER — Other Ambulatory Visit: Payer: Self-pay

## 2019-09-20 MED ORDER — ALPRAZOLAM 0.5 MG PO TABS: 0.5000 mg | ORAL_TABLET | Freq: Two times a day (BID) | ORAL | 0 refills | Status: DC | PRN

## 2019-09-22 ENCOUNTER — Ambulatory Visit (INDEPENDENT_AMBULATORY_CARE_PROVIDER_SITE_OTHER): Payer: 59 | Admitting: Psychology

## 2019-09-22 DIAGNOSIS — F330 Major depressive disorder, recurrent, mild: Secondary | ICD-10-CM | POA: Diagnosis not present

## 2019-09-22 DIAGNOSIS — F4310 Post-traumatic stress disorder, unspecified: Secondary | ICD-10-CM

## 2019-09-22 DIAGNOSIS — F4322 Adjustment disorder with anxiety: Secondary | ICD-10-CM | POA: Diagnosis not present

## 2019-09-27 ENCOUNTER — Ambulatory Visit: Payer: 59 | Admitting: Psychology

## 2019-09-28 ENCOUNTER — Encounter: Payer: Self-pay | Admitting: Family Medicine

## 2019-09-28 ENCOUNTER — Other Ambulatory Visit: Payer: Self-pay

## 2019-09-28 ENCOUNTER — Ambulatory Visit: Payer: 59 | Admitting: Family Medicine

## 2019-09-28 VITALS — BP 134/82 | HR 107 | Temp 98.2°F | Ht 66.0 in | Wt 248.2 lb

## 2019-09-28 DIAGNOSIS — F411 Generalized anxiety disorder: Secondary | ICD-10-CM | POA: Diagnosis not present

## 2019-09-28 DIAGNOSIS — F332 Major depressive disorder, recurrent severe without psychotic features: Secondary | ICD-10-CM | POA: Diagnosis not present

## 2019-09-28 DIAGNOSIS — Z566 Other physical and mental strain related to work: Secondary | ICD-10-CM | POA: Diagnosis not present

## 2019-09-28 MED ORDER — DULOXETINE HCL 30 MG PO CPEP: 30.0000 mg | ORAL_CAPSULE | Freq: Every day | ORAL | 0 refills | Status: DC

## 2019-09-28 MED ORDER — ESCITALOPRAM OXALATE 20 MG PO TABS: 20.0000 mg | ORAL_TABLET | Freq: Every day | ORAL | 2 refills | Status: DC

## 2019-09-28 NOTE — Patient Instructions (Signed)
° ° ° °  If you have lab work done today you will be contacted with your lab results within the next 2 weeks.  If you have not heard from us then please contact us. The fastest way to get your results is to register for My Chart. ° ° °IF you received an x-ray today, you will receive an invoice from [facility]. Please contact [facility] at [phone] with questions or concerns regarding your invoice.  ° °IF you received labwork today, you will receive an invoice from [facility]. Please contact [facility] at [phone] with questions or concerns regarding your invoice.  ° °Our billing staff will not be able to assist you with questions regarding bills from these companies. ° °You will be contacted with the lab results as soon as they are available. The fastest way to get your results is to activate your My Chart account. Instructions are located on the last page of this paperwork. If you have not heard from us regarding the results in 2 weeks, please contact this office. °  ° ° ° °
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
## 2019-09-29 ENCOUNTER — Ambulatory Visit (INDEPENDENT_AMBULATORY_CARE_PROVIDER_SITE_OTHER): Payer: 59 | Admitting: Psychology

## 2019-09-29 DIAGNOSIS — F4322 Adjustment disorder with anxiety: Secondary | ICD-10-CM | POA: Diagnosis not present

## 2019-09-29 DIAGNOSIS — F330 Major depressive disorder, recurrent, mild: Secondary | ICD-10-CM | POA: Diagnosis not present

## 2019-09-29 DIAGNOSIS — F4310 Post-traumatic stress disorder, unspecified: Secondary | ICD-10-CM | POA: Diagnosis not present

## 2019-10-01 ENCOUNTER — Telehealth: Payer: Self-pay

## 2019-10-01 NOTE — Telephone Encounter (Signed)
Came in today to pick up fmla paper work

## 2019-10-06 ENCOUNTER — Ambulatory Visit (INDEPENDENT_AMBULATORY_CARE_PROVIDER_SITE_OTHER): Payer: 59 | Admitting: Psychology

## 2019-10-06 DIAGNOSIS — F4322 Adjustment disorder with anxiety: Secondary | ICD-10-CM | POA: Diagnosis not present

## 2019-10-06 DIAGNOSIS — F4310 Post-traumatic stress disorder, unspecified: Secondary | ICD-10-CM

## 2019-10-06 DIAGNOSIS — F330 Major depressive disorder, recurrent, mild: Secondary | ICD-10-CM

## 2019-10-08 ENCOUNTER — Telehealth: Payer: Self-pay | Admitting: Family Medicine

## 2019-10-08 NOTE — Telephone Encounter (Signed)
Pt is requesting a letter to extend her FMLA until after your evaluation on 10/15/2019 @4pm .   Please advise if that is okay to draft up.

## 2019-10-08 NOTE — Telephone Encounter (Signed)
I spoken to provider and she has okayed the letter.  I have drafted it, she signed and it has been faxed over to [employer] [place] @ [phone] with conifrmation.  I have spoken to the pt and informed her of this. She stated understanding.

## 2019-10-08 NOTE — Telephone Encounter (Signed)
Patient is calling asking for extension on FMLA paperwork, because her  follow up appt is not until June 11th and current FMLA paperwork has her returning to work Monday June the 7th .   Please reach out to patient . Pt thinks  she just needs to write a note and send before  Monday June the 7th    Just fax extension note to [facility] medical services fax # [phone]

## 2019-10-13 ENCOUNTER — Ambulatory Visit (INDEPENDENT_AMBULATORY_CARE_PROVIDER_SITE_OTHER): Payer: 59 | Admitting: Psychology

## 2019-10-13 DIAGNOSIS — F330 Major depressive disorder, recurrent, mild: Secondary | ICD-10-CM

## 2019-10-13 DIAGNOSIS — F4322 Adjustment disorder with anxiety: Secondary | ICD-10-CM

## 2019-10-13 DIAGNOSIS — F4310 Post-traumatic stress disorder, unspecified: Secondary | ICD-10-CM

## 2019-10-14 ENCOUNTER — Other Ambulatory Visit: Payer: Self-pay | Admitting: Family Medicine

## 2019-10-15 ENCOUNTER — Encounter: Payer: Self-pay | Admitting: Family Medicine

## 2019-10-15 ENCOUNTER — Other Ambulatory Visit: Payer: Self-pay

## 2019-10-15 ENCOUNTER — Ambulatory Visit: Payer: 59 | Admitting: Family Medicine

## 2019-10-15 ENCOUNTER — Telehealth: Payer: Self-pay

## 2019-10-15 VITALS — BP 127/81 | HR 107 | Temp 97.9°F | Ht 66.0 in | Wt 243.8 lb

## 2019-10-15 DIAGNOSIS — F4310 Post-traumatic stress disorder, unspecified: Secondary | ICD-10-CM | POA: Diagnosis not present

## 2019-10-15 DIAGNOSIS — F3341 Major depressive disorder, recurrent, in partial remission: Secondary | ICD-10-CM

## 2019-10-15 MED ORDER — PRAZOSIN HCL 1 MG PO CAPS: 1.0000 mg | ORAL_CAPSULE | Freq: Two times a day (BID) | ORAL | 2 refills | Status: DC

## 2019-10-15 MED ORDER — ALPRAZOLAM 0.5 MG PO TABS: 0.5000 mg | ORAL_TABLET | Freq: Two times a day (BID) | ORAL | 0 refills | Status: DC | PRN

## 2019-10-15 MED ORDER — ESCITALOPRAM OXALATE 20 MG PO TABS: 20.0000 mg | ORAL_TABLET | Freq: Every day | ORAL | 2 refills | Status: DC

## 2019-10-15 MED ORDER — BUPROPION HCL ER (XL) 300 MG PO TB24: 300.0000 mg | ORAL_TABLET | Freq: Every day | ORAL | 2 refills | Status: DC

## 2019-10-15 MED ORDER — LOSARTAN POTASSIUM 50 MG PO TABS: 50.0000 mg | ORAL_TABLET | Freq: Every day | ORAL | 1 refills | Status: DC

## 2019-10-15 NOTE — Patient Instructions (Signed)
° ° ° °  If you have lab work done today you will be contacted with your lab results within the next 2 weeks.  If you have not heard from us then please contact us. The fastest way to get your results is to register for My Chart. ° ° °IF you received an x-ray today, you will receive an invoice from [facility]. Please contact [facility] at [phone] with questions or concerns regarding your invoice.  ° °IF you received labwork today, you will receive an invoice from [facility]. Please contact [facility] at [phone] with questions or concerns regarding your invoice.  ° °Our billing staff will not be able to assist you with questions regarding bills from these companies. ° °You will be contacted with the lab results as soon as they are available. The fastest way to get your results is to activate your My Chart account. Instructions are located on the last page of this paperwork. If you have not heard from us regarding the results in 2 weeks, please contact this office. °  ° ° ° °

## 2019-10-15 NOTE — Telephone Encounter (Signed)
Two letters for pt return to work and remaining out of work for additional two weeks have been faxed to [phone] at pt request. Pt also has copy of both letters

## 2019-10-15 NOTE — Progress Notes (Signed)
6/11/20214:22 PM  Maria Morse [birth year], 44 y.o., female [id number]  Chief Complaint  Patient presents with  . Depression    2 week f/u   . Anxiety  . Medication Refill    HPI:   Patient is a 44 y.o. female with past medical history significant for HTN, chronic fatigue,depression,PTSD and vit D deficiency who presents today for routine followup  Last OV 2 weeks ago - changed cymbalta to lexapro, referred to psychiatry - patient declined appt  Tolerated change from cymbalta or lexapro wo issues [facility] by [facility]  She does feel there has been a bit more progress and hope this past week On lexapro, wellbutrin, prazosin She has not returned to work, on [employer] to work with therapist Working on meditation and mindfulness phq 9 and gad 7 noted - denies active SI  Depression screen [facility] 2/9 10/15/2019 10/15/2019 09/28/2019  Decreased Interest 3 3 3   Down, Depressed, Hopeless 3 3 3   PHQ - 2 Score 6 6 6   Altered sleeping 3 3 3   Tired, decreased energy 3 3 3   Change in appetite 3 3 3   Feeling bad or failure about yourself  3 3 3   Trouble concentrating 3 3 3   Moving slowly or fidgety/restless 2 2 3   Suicidal thoughts - 0 1  PHQ-9 Score 23 23 25   Difficult doing work/chores Extremely dIfficult Extremely dIfficult -  Some recent data might be hidden   GAD 7 : Generalized Anxiety Score 10/15/2019 09/28/2019 09/17/2019 11/10/2018  Nervous, Anxious, on Edge 3 3 3 1   Control/stop worrying 3 3 3 1   Worry too much - different things 3 3 3 1   Trouble relaxing 3 3 3 1   Restless 0 3 3 1   Easily annoyed or irritable 2 3 3 1   Afraid - awful might happen 3 3 3 1   Total GAD 7 Score 17 21 21 7   Anxiety Difficulty Extremely difficult Extremely difficult Extremely difficult -     Fall Risk  10/15/2019 09/28/2019 09/17/2019 06/24/2019 02/09/2019  Falls in the past year? 0 0 0 0 0  Number falls in past yr: 0 0 0 0 0  Injury with Fall? 0 0 0 0 0   Follow up Falls evaluation completed Falls evaluation completed Falls evaluation completed - -     No Known Allergies  Prior to Admission medications   Medication Sig Start Date End Date Taking? Authorizing Provider  ALPRAZolam Duanne Moron) 0.5 MG tablet Take 1 tablet (0.5 mg total) by mouth 2 (two) times daily as needed for anxiety. 09/20/19  Yes Rutherford Guys, MD  buPROPion (WELLBUTRIN XL) 300 MG 24 hr tablet Take 1 tablet (300 mg total) by mouth daily. 09/17/19  Yes Rutherford Guys, MD  cetirizine (ZYRTEC) 10 MG tablet Take 10 mg by mouth daily.    Yes [provider]  cyclobenzaprine (FLEXERIL) 10 MG tablet Take 1 tablet (10 mg total) by mouth 3 (three) times daily as needed for muscle spasms. 05/21/19  Yes Rutherford Guys, MD  escitalopram (LEXAPRO) 20 MG tablet Take 1 tablet (20 mg total) by mouth daily. 09/28/19  Yes Rutherford Guys, MD  fluticasone (FLONASE) 50 MCG/ACT nasal spray Place 1 spray into both nostrils 2 (two) times daily. Patient taking differently: Place 1 spray into both nostrils 2 (two) times daily as needed for allergies.  09/03/17  Yes Rutherford Guys, MD  hydrochlorothiazide (HYDRODIURIL) 25 MG tablet TAKE ONE TABLET  BY MOUTH DAILY 07/19/19  Yes Myles Lipps, MD  hydrocortisone (PROCTOSOL HC) 2.5 % rectal cream Place 1 application rectally 2 (two) times daily. 06/24/19  Yes Myles Lipps, MD  losartan (COZAAR) 50 MG tablet TAKE ONE TABLET BY MOUTH DAILY 08/18/19  Yes Myles Lipps, MD  meloxicam (MOBIC) 15 MG tablet Take 1 tablet (15 mg total) by mouth daily. 05/21/19  Yes Myles Lipps, MD  prazosin (MINIPRESS) 1 MG capsule TAKE ONE CAPSULE BY MOUTH TWICE A DAY 08/18/19  Yes Myles Lipps, MD    Past Medical History:  Diagnosis Date  . Anxiety   . Chronic headaches   . Depression   . Hyperlipidemia   . Hypertension   . PTSD (post-traumatic stress disorder) 2007    No past surgical history on file.  Social History   Tobacco Use  .  Smoking status: Former Smoker    Quit date: 05/06/1998    Years since quitting: 21.4  . Smokeless tobacco: Never Used  Substance Use Topics  . Alcohol use: Never    Family History  Problem Relation Age of Onset  . Healthy Mother   . Diverticulitis Mother   . Hypertension Father   . Hyperlipidemia Father   . Healthy Sister   . Healthy Brother   . Stomach cancer Paternal Aunt     ROS Per hpi  OBJECTIVE:  Today's Vitals   10/15/19 1609  BP: 127/81  Pulse: (!) 107  Temp: 97.9 F (36.6 C)  TempSrc: Temporal  SpO2: 93%  Weight: 243 lb 12.8 oz (110.6 kg)  Height: 5\' 6"  (1.676 m)   Body mass index is 39.35 kg/m.   Physical Exam Vitals and nursing note reviewed.  Constitutional:      Appearance: She is well-developed.  HENT:     Head: Normocephalic and atraumatic.  Eyes:     General: No scleral icterus.    Conjunctiva/sclera: Conjunctivae normal.     Pupils: Pupils are equal, round, and reactive to light.  Pulmonary:     Effort: Pulmonary effort is normal.  Musculoskeletal:     Cervical back: Neck supple.  Skin:    General: Skin is warm and dry.  Neurological:     Mental Status: She is alert and oriented to person, place, and time.     No results found for this or any previous visit (from the past 24 hour(s)).  No results found.   ASSESSMENT and PLAN  1. Recurrent major depressive disorder, in partial remission (HCC) 2. PTSD (post-traumatic stress disorder)  Stable. No active SI. Patient on lexapro 20mg  for past week. Con wellbutrin and prazosin. Will be establishing [facility], cont with counseling. Extend FMLA for 2 weeks.   Other orders - buPROPion (WELLBUTRIN XL) 300 MG 24 hr tablet; Take 1 tablet (300 mg total) by mouth daily. - ALPRAZolam (XANAX) 0.5 MG tablet; Take 1 tablet (0.5 mg total) by mouth 2 (two) times daily as needed for anxiety. - losartan (COZAAR) 50 MG tablet; Take 1 tablet (50 mg total) by mouth daily. -  escitalopram (LEXAPRO) 20 MG tablet; Take 1 tablet (20 mg total) by mouth daily. - prazosin (MINIPRESS) 1 MG capsule; Take 1 capsule (1 mg total) by mouth 2 (two) times daily.  Return in about 3 weeks (around 11/05/2019).    , MD Primary Care at [facility] [address] [place], [address] [place] Ph.  [phone] Fax [phone]

## 2019-10-18 ENCOUNTER — Other Ambulatory Visit: Payer: Self-pay | Admitting: Family Medicine

## 2019-10-18 ENCOUNTER — Other Ambulatory Visit: Payer: Self-pay | Admitting: Emergency Medicine

## 2019-10-18 NOTE — Telephone Encounter (Signed)
Prescription request for duloxetine 30 mg and duloxetine 60 mg not on current medication list.

## 2019-10-21 ENCOUNTER — Ambulatory Visit (INDEPENDENT_AMBULATORY_CARE_PROVIDER_SITE_OTHER): Payer: 59 | Admitting: Psychology

## 2019-10-21 DIAGNOSIS — F330 Major depressive disorder, recurrent, mild: Secondary | ICD-10-CM

## 2019-10-21 DIAGNOSIS — F4310 Post-traumatic stress disorder, unspecified: Secondary | ICD-10-CM | POA: Diagnosis not present

## 2019-10-21 DIAGNOSIS — F4322 Adjustment disorder with anxiety: Secondary | ICD-10-CM

## 2019-10-26 ENCOUNTER — Ambulatory Visit (INDEPENDENT_AMBULATORY_CARE_PROVIDER_SITE_OTHER): Payer: 59 | Admitting: Psychology

## 2019-10-26 DIAGNOSIS — F4310 Post-traumatic stress disorder, unspecified: Secondary | ICD-10-CM

## 2019-10-26 DIAGNOSIS — F330 Major depressive disorder, recurrent, mild: Secondary | ICD-10-CM | POA: Diagnosis not present

## 2019-10-26 DIAGNOSIS — F4322 Adjustment disorder with anxiety: Secondary | ICD-10-CM | POA: Diagnosis not present

## 2019-10-27 ENCOUNTER — Encounter: Payer: Self-pay | Admitting: Adult Health

## 2019-10-27 ENCOUNTER — Other Ambulatory Visit: Payer: Self-pay

## 2019-10-27 ENCOUNTER — Ambulatory Visit (INDEPENDENT_AMBULATORY_CARE_PROVIDER_SITE_OTHER): Payer: 59 | Admitting: Adult Health

## 2019-10-27 VITALS — BP 134/85 | HR 73 | Ht 67.0 in | Wt 243.0 lb

## 2019-10-27 DIAGNOSIS — F331 Major depressive disorder, recurrent, moderate: Secondary | ICD-10-CM | POA: Diagnosis not present

## 2019-10-27 DIAGNOSIS — F4310 Post-traumatic stress disorder, unspecified: Secondary | ICD-10-CM

## 2019-10-27 DIAGNOSIS — F411 Generalized anxiety disorder: Secondary | ICD-10-CM | POA: Diagnosis not present

## 2019-10-27 DIAGNOSIS — F410 Panic disorder [episodic paroxysmal anxiety] without agoraphobia: Secondary | ICD-10-CM | POA: Diagnosis not present

## 2019-10-27 MED ORDER — ALPRAZOLAM 0.5 MG PO TABS: 0.5000 mg | ORAL_TABLET | Freq: Two times a day (BID) | ORAL | 0 refills | Status: DC | PRN
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
## 2019-10-29 ENCOUNTER — Telehealth: Payer: Self-pay | Admitting: Family Medicine

## 2019-10-29 ENCOUNTER — Telehealth: Payer: Self-pay | Admitting: Adult Health

## 2019-10-29 NOTE — Telephone Encounter (Signed)
Patient is calling to check on the status of her FMLA CB- [phone]

## 2019-10-29 NOTE — Telephone Encounter (Signed)
Pt is Calling to check on the status on her FML Paper Work [phone] Please Advice

## 2019-10-29 NOTE — Telephone Encounter (Signed)
I am unable to find the FMLA forms. Do you have them? I do see the letters that has been written for pt in the chart.

## 2019-10-29 NOTE — Telephone Encounter (Signed)
Pt called to say her PCP had done her FMLA ppwk. She has been unable to get them to extend her leave and her PCP is out on vacation. She is going to just return to work on Monday. Just letting you know.

## 2019-10-29 NOTE — Telephone Encounter (Signed)
Pt what to cancel her FML paper Work for the lack of Respond

## 2019-11-01 ENCOUNTER — Other Ambulatory Visit: Payer: Self-pay | Admitting: Family Medicine

## 2019-11-01 NOTE — Telephone Encounter (Signed)
Patient is requesting a refill of the following medications: Requested Prescriptions   Pending Prescriptions Disp Refills  . DULoxetine (CYMBALTA) 60 MG capsule [Pharmacy Med Name: DULOXETINE HCL DR 60 MG CAPSULE] 30 capsule 0    Sig: TAKE ONE CAPSULE BY MOUTH DAILY    Date of patient request: 11/01/2019 Last office visit: 10/15/2019 Date of last refill: 09/28/2019 Last refill amount: 14 capsules  Follow up time period per chart:11/05/2019

## 2019-11-01 NOTE — Telephone Encounter (Signed)
Noted  

## 2019-11-01 NOTE — Progress Notes (Signed)
Pt letter has been pended for an additional 30 days per Dr. Leretha Pol. Called pt to make her aware, left vm

## 2019-11-01 NOTE — Telephone Encounter (Signed)
Requested medication (s) are due for refill today: no  Requested medication (s) are on the active medication list: no  Last refill:  n/a  Future visit scheduled: yes  Notes to clinic:  medication discontinued on 09/28/19     Requested Prescriptions  Pending Prescriptions Disp Refills   DULoxetine (CYMBALTA) 60 MG capsule [Pharmacy Med Name: DULOXETINE HCL DR 60 MG CAPSULE] 30 capsule 0    Sig: TAKE ONE CAPSULE BY MOUTH DAILY      Psychiatry: Antidepressants - SNRI Passed - 11/01/2019  5:01 PM      Passed - Completed PHQ-2 or PHQ-9 in the last 360 days.      Passed - Last BP in normal range    BP Readings from Last 1 Encounters:  10/15/19 127/81          Passed - Valid encounter within last 6 months    Recent Outpatient Visits           2 weeks ago Recurrent major depressive disorder, in partial remission [facility])   [facility] [facility], Meda Coffee, MD   1 month ago Severe episode of recurrent major depressive disorder, without psychotic features [facility])   [facility], Meda Coffee, MD   1 month ago Severe episode of recurrent major depressive disorder, without psychotic features [facility])   [facility], Meda Coffee, MD   4 months ago Recurrent major depressive disorder, in partial remission [facility])   [facility], Meda Coffee, MD   5 months ago Recurrent major depressive disorder, in partial remission [facility])   [facility], Meda Coffee, MD       Future Appointments             In 4 days Myles Lipps, MD Primary Care at [place], [facility]

## 2019-11-01 NOTE — Telephone Encounter (Signed)
addressed

## 2019-11-02 NOTE — Telephone Encounter (Signed)
Patient called and was informed about the letter that was approved by her pcp for an additional 30 days . Although she stated that she will not be needing it anymore.

## 2019-11-02 NOTE — Telephone Encounter (Signed)
Noted, pt does not need letter. She has returned to work

## 2019-11-05 ENCOUNTER — Ambulatory Visit: Payer: 59 | Admitting: Family Medicine

## 2019-11-05 ENCOUNTER — Encounter: Payer: Self-pay | Admitting: Family Medicine

## 2019-11-05 ENCOUNTER — Other Ambulatory Visit: Payer: Self-pay

## 2019-11-05 VITALS — BP 138/86 | HR 81 | Temp 98.2°F | Ht 67.0 in | Wt 245.0 lb

## 2019-11-05 DIAGNOSIS — F418 Other specified anxiety disorders: Secondary | ICD-10-CM

## 2019-11-05 DIAGNOSIS — E559 Vitamin D deficiency, unspecified: Secondary | ICD-10-CM

## 2019-11-05 DIAGNOSIS — I10 Essential (primary) hypertension: Secondary | ICD-10-CM | POA: Diagnosis not present

## 2019-11-05 DIAGNOSIS — H6992 Unspecified Eustachian tube disorder, left ear: Secondary | ICD-10-CM

## 2019-11-05 MED ORDER — FLUTICASONE PROPIONATE 50 MCG/ACT NA SUSP: 1.0000 | Freq: Two times a day (BID) | NASAL | 5 refills | Status: AC | PRN

## 2019-11-05 NOTE — Patient Instructions (Signed)
° ° ° °  If you have lab work done today you will be contacted with your lab results within the next 2 weeks.  If you have not heard from us then please contact us. The fastest way to get your results is to register for My Chart. ° ° °IF you received an x-ray today, you will receive an invoice from [facility]. Please contact [facility] at [phone] with questions or concerns regarding your invoice.  ° °IF you received labwork today, you will receive an invoice from [facility]. Please contact [facility] at [phone] with questions or concerns regarding your invoice.  ° °Our billing staff will not be able to assist you with questions regarding bills from these companies. ° °You will be contacted with the lab results as soon as they are available. The fastest way to get your results is to activate your My Chart account. Instructions are located on the last page of this paperwork. If you have not heard from us regarding the results in 2 weeks, please contact this office. °  ° ° ° °

## 2019-11-05 NOTE — Progress Notes (Signed)
7/2/20218:17 AM  Maria Morse 1975-08-31, 44 y.o., female [id number]  Chief Complaint  Patient presents with  . Depression    f/u - returned to work on 6/28  . L ear feeling clogged    better after benadryl     HPI:   Patient is a 44 y.o. female with past medical history significant for HTN, chronic fatigue,depression,PTSD and vit D deficiencywho presents today for routine followup  Last OV June 2021 - no changes, extended FMLA for 2 weeks, she was going to establish with psych and cont counseling  Returned to work this past week, got written warning Has had several panic attacks, has been taking 1/2 xanax when severe enough, working on breathing and mindfulness  Meet with psychiatry last week, next appt in 3 weeks No med changes were done Feels current meds are more effective, more grounded, less doom and gloom Cont to work with therapist weekly  phq 9 and gad 7 noted  Thinking about going back to school for RT  She takes vitamin D 4000 units  Thinking about resuming kick boxing  Cont to take hctz and losartan wo issues  Having left ear pressure and left nasal congestion Has not been using flonase Benadryl helping Normal hearing, no ssx URI  GAD 7 : Generalized Anxiety Score 11/05/2019 10/15/2019 09/28/2019 09/17/2019  Nervous, Anxious, on Edge 3 3 3 3   Control/stop worrying 2 3 3 3   Worry too much - different things 2 3 3 3   Trouble relaxing 1 3 3 3   Restless 1 0 3 3  Easily annoyed or irritable 2 2 3 3   Afraid - awful might happen 3 3 3 3   Total GAD 7 Score 14 17 21 21   Anxiety Difficulty Very difficult Extremely difficult Extremely difficult Extremely difficult     Depression screen [facility] 2/9 11/05/2019 10/15/2019 10/15/2019  Decreased Interest 2 3 3   Down, Depressed, Hopeless 2 3 3   PHQ - 2 Score 4 6 6   Altered sleeping 2 3 3   Tired, decreased energy 2 3 3   Change in appetite 2 3 3   Feeling bad or failure about yourself  2 3 3   Trouble concentrating 2  3 3   Moving slowly or fidgety/restless 2 2 2   Suicidal thoughts 0 - 0  PHQ-9 Score 16 23 23   Difficult doing work/chores Very difficult Extremely dIfficult Extremely dIfficult  Some recent data might be hidden    Fall Risk  11/05/2019 10/15/2019 09/28/2019 09/17/2019 06/24/2019  Falls in the past year? 0 0 0 0 0  Number falls in past yr: 0 0 0 0 0  Injury with Fall? 0 0 0 0 0  Follow up Falls evaluation completed Falls evaluation completed Falls evaluation completed Falls evaluation completed -     No Known Allergies  Prior to Admission medications   Medication Sig Start Date End Date Taking? Authorizing Provider  ALPRAZolam 12/15/2019) 0.5 MG tablet Take 1 tablet (0.5 mg total) by mouth 2 (two) times daily as needed for anxiety. 10/27/19  Yes Moz[place], , NP  buPROPion (WELLBUTRIN XL) 300 MG 24 hr tablet Take 1 tablet (300 mg total) by mouth daily. 10/15/19  Yes , MD  cetirizine (ZYRTEC) 10 MG tablet Take 10 mg by mouth daily.    Yes [provider]  cyclobenzaprine (FLEXERIL) 10 MG tablet Take 1 tablet (10 mg total) by mouth 3 (three) times daily as needed for muscle spasms. 05/21/19  Yes , MD  DULoxetine (CYMBALTA) 60 MG capsule TAKE ONE CAPSULE BY MOUTH DAILY 11/01/19  Yes Myles Lipps, MD  escitalopram (LEXAPRO) 20 MG tablet Take 1 tablet (20 mg total) by mouth daily. 10/15/19  Yes Myles Lipps, MD  fluticasone [facility]) 50 MCG/ACT nasal spray Place 1 spray into both nostrils 2 (two) times daily. Patient taking differently: Place 1 spray into both nostrils 2 (two) times daily as needed for allergies.  09/03/17  Yes Myles Lipps, MD  hydrochlorothiazide (HYDRODIURIL) 25 MG tablet TAKE ONE TABLET BY MOUTH DAILY 07/19/19  Yes Myles Lipps, MD  hydrocortisone (PROCTOSOL HC) 2.5 % rectal cream Place 1 application rectally 2 (two) times daily. 06/24/19  Yes Myles Lipps, MD  losartan (COZAAR) 50 MG tablet Take 1 tablet (50 mg total)  by mouth daily. 10/15/19  Yes Myles Lipps, MD  meloxicam (MOBIC) 15 MG tablet Take 1 tablet (15 mg total) by mouth daily. 05/21/19  Yes Myles Lipps, MD  prazosin (MINIPRESS) 1 MG capsule Take 1 capsule (1 mg total) by mouth 2 (two) times daily. 10/15/19  Yes Myles Lipps, MD    Past Medical History:  Diagnosis Date  . Anxiety   . Chronic headaches   . Depression   . Hyperlipidemia   . Hypertension   . PTSD (post-traumatic stress disorder) 2007    No past surgical history on file.  Social History   Tobacco Use  . Smoking status: Former Smoker    Quit date: 05/06/1998    Years since quitting: 21.5  . Smokeless tobacco: Never Used  Substance Use Topics  . Alcohol use: Never    Family History  Problem Relation Age of Onset  . Healthy Mother   . Diverticulitis Mother   . Hypertension Father   . Hyperlipidemia Father   . Healthy Sister   . Healthy Brother   . Stomach cancer Paternal Aunt     Review of Systems  Constitutional: Negative for chills and fever.  Respiratory: Negative for cough and shortness of breath.   Cardiovascular: Negative for chest pain, palpitations and leg swelling.  Gastrointestinal: Negative for abdominal pain, nausea and vomiting.   Per hpi  OBJECTIVE:  Today's Vitals   11/05/19 0815 11/05/19 0822  BP: (!) 152/86 138/86  Pulse: 81   Temp: 98.2 F (36.8 C)   SpO2: 98%   Weight: 245 lb (111.1 kg)   Height: 5\' 7"  (1.702 m)    Body mass index is 38.37 kg/m.  Wt Readings from Last 3 Encounters:  11/05/19 245 lb (111.1 kg)  10/15/19 243 lb 12.8 oz (110.6 kg)  09/28/19 248 lb 3.2 oz (112.6 kg)    Physical Exam Vitals and nursing note reviewed.  Constitutional:      Appearance: She is well-developed.  HENT:     Head: Normocephalic and atraumatic.     Right Ear: Hearing, tympanic membrane, ear canal and external ear normal.     Left Ear: Hearing, tympanic membrane, ear canal and external ear normal.  Eyes:      Conjunctiva/sclera: Conjunctivae normal.     Pupils: Pupils are equal, round, and reactive to light.  Cardiovascular:     Rate and Rhythm: Normal rate and regular rhythm.     Heart sounds: Normal heart sounds. No murmur heard.  No friction rub. No gallop.   Pulmonary:     Effort: Pulmonary effort is normal.     Breath sounds: Normal breath sounds. No wheezing or rales.  Musculoskeletal:  Cervical back: Neck supple.  Lymphadenopathy:     Cervical: No cervical adenopathy.  Skin:    General: Skin is warm and dry.  Neurological:     Mental Status: She is alert and oriented to person, place, and time.     No results found for this or any previous visit (from the past 24 hour(s)).  No results found.   ASSESSMENT and PLAN  1. Mixed anxiety depressive disorder Improved. Cont current meds. Transitioning care to psych. Cont counseling  2. Essential hypertension, benign Controlled. Continue current regime. Discussed LFM - Comprehensive metabolic panel - Lipid panel - Hemoglobin A1c  3. Vitamin D deficiency Checking labs today, medications will be adjusted as needed.  - Vitamin D, 25-hydroxy  4. Eustachian tube disorder, left Resume flonase  Other orders - fluticasone (FLONASE) 50 MCG/ACT nasal spray; Place 1 spray into both nostrils 2 (two) times daily as needed for allergies.  Return in about 6 weeks (around 12/17/2019).    Myles Lipps, MD Primary Care at [facility] [address] [place], [place] [address] Ph.  [phone] Fax [phone]

## 2019-11-06 LAB — COMPREHENSIVE METABOLIC PANEL
ALT: 20 IU/L (ref 0–32)
AST: 16 IU/L (ref 0–40)
Albumin/Globulin Ratio: 1.8 (ref 1.2–2.2)
Albumin: 4.2 g/dL (ref 3.8–4.8)
Alkaline Phosphatase: 77 IU/L (ref 48–121)
BUN/Creatinine Ratio: 13 (ref 9–23)
BUN: 13 mg/dL (ref 6–24)
Bilirubin Total: <0.2 mg/dL (ref 0.0–1.2)
CO2: 23 mmol/L (ref 20–29)
Calcium: 9.3 mg/dL (ref 8.7–10.2)
Chloride: 104 mmol/L (ref 96–106)
Creatinine, Ser: 0.98 mg/dL (ref 0.57–1.00)
GFR calc Af Amer: 82 mL/min/{1.73_m2} (ref >59)
GFR calc non Af Amer: 71 mL/min/{1.73_m2} (ref >59)
Globulin, Total: 2.4 g/dL (ref 1.5–4.5)
Glucose: 95 mg/dL (ref 65–99)
Potassium: 4.4 mmol/L (ref 3.5–5.2)
Sodium: 142 mmol/L (ref 134–144)
Total Protein: 6.6 g/dL (ref 6.0–8.5)

## 2019-11-06 LAB — LIPID PANEL
Chol/HDL Ratio: 4.8 ratio — ABNORMAL HIGH (ref 0.0–4.4)
Cholesterol, Total: 207 mg/dL — ABNORMAL HIGH (ref 100–199)
HDL: 43 mg/dL (ref >39)
LDL Chol Calc (NIH): 131 mg/dL — ABNORMAL HIGH (ref 0–99)
Triglycerides: 184 mg/dL — ABNORMAL HIGH (ref 0–149)
VLDL Cholesterol Cal: 33 mg/dL (ref 5–40)

## 2019-11-06 LAB — HEMOGLOBIN A1C
Est. average glucose Bld gHb Est-mCnc: 108 mg/dL
Hgb A1c MFr Bld: 5.4 % (ref 4.8–5.6)

## 2019-11-06 LAB — VITAMIN D 25 HYDROXY (VIT D DEFICIENCY, FRACTURES): Vit D, 25-Hydroxy: 45.7 ng/mL (ref 30.0–100.0)

## 2019-11-09 ENCOUNTER — Ambulatory Visit (INDEPENDENT_AMBULATORY_CARE_PROVIDER_SITE_OTHER): Payer: 59 | Admitting: Psychology

## 2019-11-09 DIAGNOSIS — F4310 Post-traumatic stress disorder, unspecified: Secondary | ICD-10-CM | POA: Diagnosis not present

## 2019-11-09 DIAGNOSIS — F4322 Adjustment disorder with anxiety: Secondary | ICD-10-CM | POA: Diagnosis not present

## 2019-11-09 DIAGNOSIS — F330 Major depressive disorder, recurrent, mild: Secondary | ICD-10-CM

## 2019-11-11 ENCOUNTER — Telehealth: Payer: Self-pay | Admitting: Family Medicine

## 2019-11-11 DIAGNOSIS — F411 Generalized anxiety disorder: Secondary | ICD-10-CM

## 2019-11-11 DIAGNOSIS — F410 Panic disorder [episodic paroxysmal anxiety] without agoraphobia: Secondary | ICD-10-CM

## 2019-11-11 MED ORDER — ALPRAZOLAM 0.5 MG PO TABS: 0.5000 mg | ORAL_TABLET | Freq: Two times a day (BID) | ORAL | 0 refills | Status: DC | PRN

## 2019-11-11 MED ORDER — BUPROPION HCL ER (XL) 300 MG PO TB24: 300.0000 mg | ORAL_TABLET | Freq: Every day | ORAL | 0 refills | Status: DC

## 2019-11-11 NOTE — Telephone Encounter (Signed)
Patient would like a refill until get in with [facility]?

## 2019-11-11 NOTE — Telephone Encounter (Signed)
Patient is calling to ask for 2 refills on until pt is on track with [facility]   What is the name of the medication?  buPROPion (WELLBUTRIN XL) 300 MG 24 hr tablet  And ALPRAZolam (XANAX) 0.5 MG tablet      Have you contacted your pharmacy to request a refill? n  Which pharmacy would you like this sent to? [facility] [place], [place] - [address]  [address], [place] [address]  Phone:  [phone] Fax:  [phone]    Patient notified that their request is being sent to the clinical staff for review and that they should receive a call once it is complete. If they do not receive a call within 72 hours they can check with their pharmacy or our office.

## 2019-11-12 ENCOUNTER — Telehealth: Payer: Self-pay

## 2019-11-12 NOTE — Telephone Encounter (Signed)
Copied from CRM [id number]. Topic: General - Other >> Oct 27, 2019 11:21 AM Dalphine Handing A wrote: Patient went to [facility] psychiatry referral appt and was recommended to extend her FMLA paperwork 4 more weeks and would like the paperword faxed [phone]. Patient also wanted to inform PCP that she has a follow up appointment on 07/21 and is requesting a callback once extension has been completed. Please advise

## 2019-11-12 NOTE — Telephone Encounter (Signed)
Is It ok to extend patient FLMA forms?

## 2019-11-13 NOTE — Telephone Encounter (Signed)
Yes please extend her FMLA. thanks

## 2019-11-16 ENCOUNTER — Telehealth: Payer: Self-pay | Admitting: Emergency Medicine

## 2019-11-16 ENCOUNTER — Ambulatory Visit (INDEPENDENT_AMBULATORY_CARE_PROVIDER_SITE_OTHER): Payer: 59 | Admitting: Psychology

## 2019-11-16 DIAGNOSIS — F330 Major depressive disorder, recurrent, mild: Secondary | ICD-10-CM | POA: Diagnosis not present

## 2019-11-16 DIAGNOSIS — F4322 Adjustment disorder with anxiety: Secondary | ICD-10-CM | POA: Diagnosis not present

## 2019-11-16 DIAGNOSIS — F4310 Post-traumatic stress disorder, unspecified: Secondary | ICD-10-CM | POA: Diagnosis not present

## 2019-11-16 NOTE — Telephone Encounter (Signed)
Left a message on patient's machine that we have completed and refax updated FMLA with the additional  30 day off work to employer and a copy is at front desk for pick up. If she have any question to return call.

## 2019-11-16 NOTE — Telephone Encounter (Signed)
Pt called in to speak with supervisor or Provider regarding this paperwork  Levin Bacon took the call

## 2019-11-16 NOTE — Telephone Encounter (Signed)
Maria Morse from [employer] [employer] [place] returned my call and stated they discarded any fax that came through today with Maria Morse "Maria Morse" name on them. Due to message and instruction I left them to let me know that they have received and discarded. I also called patient to let her know that I spoke to [place] [facility] [place] and they did discard all faxes with her name on them today.

## 2019-11-16 NOTE — Telephone Encounter (Signed)
When I left patient a msg that the forms were ready for pick up. Patient called back and stated she did not need an additional 30 day off work and that she has since returned back to work. This was a old msg and she needed this corrected ASAP. After speaking with patient I called patient employer at [employer] [place]. I spoke to patient's employer Contact person Mliss Fritz at [phone]. I explained to her that we had faxed updated FMLA forms with extension of patient's time off for additional 30 days. These form needed to be discarded due to error.Sheralyn Boatman informed me that I would need to contact Amy Stock or Rande Brunt at [phone]. I left a detail message on the number given by Sheralyn Boatman which was the Medical Service Division answer machine, I left a detail msg that I had faxed these updated FMLA forms and that I needed to retract them due to incorrect information. I asked that either Amy Stock or Rande Brunt to return my call ASAP letting me know that they have received this message and that these forms has been discarded. I left my name and office number I could be reached. I will also call again after lunch in case the office was closed for lunch. Meantime patient called again and I let patient know that I have called the [facility] [facility] medical service Division and I will keep trying to reach out to that office until we get this corrected and Dr Leretha Pol will call her by the end of the day to speak with her. After checking the fax I noticed that the faxed to the [place] [place] failed and the employer probably will not receive this cause it failed. But I will still f/u to be sure the [facility] did not receive this form. I called patient again and informed her that the fax failed and did not go through.

## 2019-11-18 NOTE — Telephone Encounter (Signed)
There was misunderstanding about this FMLA form and patient do not need this extended form and this has been resolved

## 2019-11-24 ENCOUNTER — Ambulatory Visit (INDEPENDENT_AMBULATORY_CARE_PROVIDER_SITE_OTHER): Payer: 59 | Admitting: Adult Health

## 2019-11-24 ENCOUNTER — Other Ambulatory Visit: Payer: Self-pay

## 2019-11-24 ENCOUNTER — Encounter: Payer: Self-pay | Admitting: Adult Health

## 2019-11-24 DIAGNOSIS — F411 Generalized anxiety disorder: Secondary | ICD-10-CM | POA: Diagnosis not present

## 2019-11-24 DIAGNOSIS — F4310 Post-traumatic stress disorder, unspecified: Secondary | ICD-10-CM

## 2019-11-24 DIAGNOSIS — F331 Major depressive disorder, recurrent, moderate: Secondary | ICD-10-CM

## 2019-11-24 DIAGNOSIS — F410 Panic disorder [episodic paroxysmal anxiety] without agoraphobia: Secondary | ICD-10-CM

## 2019-11-24 MED ORDER — PRAZOSIN HCL 1 MG PO CAPS: 1.0000 mg | ORAL_CAPSULE | Freq: Two times a day (BID) | ORAL | 2 refills | Status: DC

## 2019-11-24 MED ORDER — ALPRAZOLAM 0.5 MG PO TABS: 0.5000 mg | ORAL_TABLET | Freq: Two times a day (BID) | ORAL | 2 refills | Status: DC | PRN

## 2019-11-24 MED ORDER — BUPROPION HCL ER (XL) 300 MG PO TB24: 300.0000 mg | ORAL_TABLET | Freq: Every day | ORAL | 2 refills | Status: DC

## 2019-11-24 MED ORDER — ESCITALOPRAM OXALATE 20 MG PO TABS: 20.0000 mg | ORAL_TABLET | Freq: Every day | ORAL | 2 refills | Status: DC

## 2019-11-24 NOTE — Progress Notes (Signed)
Maria BeckKathryn Denham [id number] June 17, 1975 44 y.o.  Subjective:   Patient ID:  Maria Morse is a 44 y.o. (DOB June 17, 1975) female.  Chief Complaint: No chief complaint on file.   HPI Maria Morse presents to the office today for follow-up of MDD, GAD, panic attacks, and PTSD.   Describes mood today as "not very good". Denied any recent tearfulness. Mood symptoms - reports depression, anxiety, and irritability. Panic attacks. Trying to meditate and pray. Stating "I'm doing the best I can". Issues in work setting. Returned to work after being out on [employer]. Conflict with Production designer, theatre/television/filmmanager. Suspension hearing today.Feels embarrassed. Feels like medications have helped. Varying interest and motivation. Taking medications as prescribed. Working with therapist - Maggie FontKeith Cottle.  Energy levels "low to moderate". Chronic fatigue. History of Malachi Carlpstein Barr at 3716. Active, does not have a regular exercise routine.  Enjoys some usual interests and activities. Divorced. Not dating. Single. No children. Lives alone with her 2 dogs 13 and 11. Sister in [place]. Has a nephew at [employer]. Spending time with family. [place] 2 years ago. Staying connected with women in her network.   Appetite "ok". Weight - 243 pounds. Sleep has improved. Averages 6 to 8 hours on good nights - 3 to 4 on bad nights. Having more good nights.  Focus and concentration "a little better, not very sharp". Feels a little "muttled". Completing tasks. Managing some aspects of household. Issues in work setting [place] [employer] [place].. Denies SI or HI. Denies AH or VH.  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 Youngest of 4 children. Father deceased in 2004. Mother living. Divorced - was married for 5 years, together for 7 to 8 years.   4 year B.A degree  Previous medication trials: Cymbalta x 4 years   GAD-7     [facility] from [facility] Office Visit from  10/[facility]/2019 [facility] Office Visit from 09/28/2019 [facility] Video Visit from [facility] Office Visit from 11/10[facility]2018 [facility]  Total GAD-7 Score 14 17 21 21 7     PHQ2-9     [facility] Office Visit [facility]2019 [facility] Office Visit from [facility]/2019 [facility] Video Visit from 09/17/2019 [facility] Telemedicine from [facility]/24/2019 [facility]  PHQ-2 Total Score 4 6 6 6 4   PHQ-9 Total Score 16 23 25 25 15        Review of Systems:  Review of Systems  Musculoskeletal: Negative for gait problem.  Neurological: Negative for tremors.  Psychiatric/Behavioral:       Please refer to HPI    Medications: I have reviewed the patient's current medications.  Current Outpatient Medications  Medication Sig Dispense Refill  . ALPRAZolam (XANAX) 0.5 MG tablet Take 1 tablet (0.5 mg total) by mouth 2 (two) times daily as needed for anxiety. 60 tablet 2  . buPROPion (WELLBUTRIN XL) 300 MG 24 hr tablet Take 1 tablet (300 mg total) by mouth daily. 30 tablet 2  . cetirizine (ZYRTEC) 10 MG tablet Take 10 mg by mouth daily.     . cyclobenzaprine (FLEXERIL) 10 MG tablet Take 1 tablet (10 mg total) by mouth 3 (three) times daily as needed for muscle spasms. 30 tablet 0  . escitalopram (LEXAPRO) 20 MG  tablet Take 1 tablet (20 mg total) by mouth daily. 30 tablet 2  . fluticasone (FLONASE) 50 MCG/ACT nasal spray Place 1 spray into both nostrils 2 (two) times daily as needed for allergies. 16 g 5  . hydrochlorothiazide (HYDRODIURIL) 25 MG tablet TAKE ONE TABLET BY MOUTH DAILY 90 tablet 1  . hydrocortisone (PROCTOSOL HC) 2.5 % rectal cream Place 1 application rectally 2 (two) times daily. 28 g 2  . losartan (COZAAR) 50 MG tablet Take 1 tablet (50 mg total) by mouth daily. 90 tablet 1  . meloxicam (MOBIC) 15 MG tablet Take 1 tablet (15 mg total) by mouth  daily. 30 tablet 0  . prazosin (MINIPRESS) 1 MG capsule Take 1 capsule (1 mg total) by mouth 2 (two) times daily. 60 capsule 2   No current facility-administered medications for this visit.    Medication Side Effects: None  Allergies: No Known Allergies  Past Medical History:  Diagnosis Date  . Anxiety   . Chronic headaches   . Depression   . Hyperlipidemia   . Hypertension   . PTSD (post-traumatic stress disorder) 2007    Family History  Problem Relation Age of Onset  . Healthy Mother   . Diverticulitis Mother   . Hypertension Father   . Hyperlipidemia Father   . Healthy Sister   . Healthy Brother   . Stomach cancer Paternal Aunt     Social History   Socioeconomic History  . Marital status: Single    Spouse name: Not on file  . Number of children: Not on file  . Years of education: Not on file  . Highest education level: Not on file  Occupational History  . Not on file  Tobacco Use  . Smoking status: Former Smoker    Quit date: 05/06/1998    Years since quitting: 21.5  . Smokeless tobacco: Never Used  Substance and Sexual Activity  . Alcohol use: Never  . Drug use: Never  . Sexual activity: Not Currently  Other Topics Concern  . Not on file  Social History Narrative  . Not on file   Social Determinants of Health   Financial Resource Strain:   . Difficulty of Paying Living Expenses:   Food Insecurity:   . Worried About Programme researcher, broadcasting/film/video in the Last Year:   . Barista in the Last Year:   Transportation Needs:   . Freight forwarder (Medical):   Marland Kitchen Lack of Transportation (Non-Medical):   Physical Activity:   . Days of Exercise per Week:   . Minutes of Exercise per Session:   Stress:   . Feeling of Stress :   Social Connections:   . Frequency of Communication with Friends and Family:   . Frequency of Social Gatherings with Friends and Family:   . Attends Religious Services:   . Active Member of Clubs or Organizations:   . Attends Occupational hygienist Meetings:   Marland Kitchen Marital Status:   Intimate Partner Violence:   . Fear of Current or Ex-Partner:   . Emotionally Abused:   Marland Kitchen Physically Abused:   . Sexually Abused:     Past Medical History, Surgical history, Social history, and Family history were reviewed and updated as appropriate.   Please see review of systems for further details on the patient's review from today.   Objective:   Physical Exam:  There were no vitals taken for this visit.  Physical Exam Constitutional:      General: She  is not in acute distress. Musculoskeletal:        General: No deformity.  Neurological:     Mental Status: She is alert and oriented to person, place, and time.     Coordination: Coordination normal.  Psychiatric:        Attention and Perception: Attention and perception normal. She does not perceive auditory or visual hallucinations.        Mood and Affect: Mood normal. Mood is not anxious or depressed. Affect is not labile, blunt, angry or inappropriate.        Speech: Speech normal.        Behavior: Behavior normal.        Thought Content: Thought content normal. Thought content is not paranoid or delusional. Thought content does not include homicidal or suicidal ideation. Thought content does not include homicidal or suicidal plan.        Cognition and Memory: Cognition and memory normal.        Judgment: Judgment normal.     Comments: Insight intact     Lab Review:     Component Value Date/Time   NA 142 11/05/2019 0826   K 4.4 11/05/2019 0826   CL 104 11/05/2019 0826   CO2 23 11/05/2019 0826   GLUCOSE 95 11/05/2019 0826   BUN 13 11/05/2019 0826   CREATININE 0.98 11/05/2019 0826   CALCIUM 9.3 11/05/2019 0826   PROT 6.6 11/05/2019 0826   ALBUMIN 4.2 11/05/2019 0826   AST 16 11/05/2019 0826   ALT 20 11/05/2019 0826   ALKPHOS 77 11/05/2019 0826   BILITOT <0.2 11/05/2019 0826   GFRNONAA 71 11/05/2019 0826   GFRAA 82 11/05/2019 0826       Component Value  Date/Time   WBC 10.3 12/29/2018 1422   RBC 4.34 12/29/2018 1422   HGB 12.1 12/29/2018 1422   HCT 36.9 12/29/2018 1422   PLT 341 12/29/2018 1422   MCV 85 12/29/2018 1422   MCH 27.9 12/29/2018 1422   MCHC 32.8 12/29/2018 1422   RDW 12.9 12/29/2018 1422   LYMPHSABS 2.5 12/29/2018 1422   EOSABS 0.2 12/29/2018 1422   BASOSABS 0.1 12/29/2018 1422    No results found for: POCLITH, LITHIUM   No results found for: PHENYTOIN, PHENOBARB, VALPROATE, CBMZ   .res Assessment: Plan:    Plan:  PDMP reviewed  1. Lexapro 20mg  daily 2. Wellbutrin XL 300mg  every morning - denies seizures 3. Prazosin 1mg  BID 4. Xanax 0.5mg  BID prn - mostly takes at night  RTC 4 weeks  Patient advised to contact office with any questions, adverse effects, or acute worsening in signs and symptoms.  Discussed potential benefits, risk, and side effects of benzodiazepines to include potential risk of tolerance and dependence, as well as possible drowsiness.  Advised patient not to drive if experiencing drowsiness and to take lowest possible effective dose to minimize risk of dependence and tolerance.    Diagnoses and all orders for this visit:  Generalized anxiety disorder -     buPROPion (WELLBUTRIN XL) 300 MG 24 hr tablet; Take 1 tablet (300 mg total) by mouth daily. -     escitalopram (LEXAPRO) 20 MG tablet; Take 1 tablet (20 mg total) by mouth daily. -     ALPRAZolam (XANAX) 0.5 MG tablet; Take 1 tablet (0.5 mg total) by mouth 2 (two) times daily as needed for anxiety.  PTSD (post-traumatic stress disorder) -     buPROPion (WELLBUTRIN XL) 300 MG 24 hr tablet; Take 1 tablet (300  mg total) by mouth daily. -     escitalopram (LEXAPRO) 20 MG tablet; Take 1 tablet (20 mg total) by mouth daily. -     prazosin (MINIPRESS) 1 MG capsule; Take 1 capsule (1 mg total) by mouth 2 (two) times daily.  Major depressive disorder, recurrent episode, moderate (HCC) -     buPROPion (WELLBUTRIN XL) 300 MG 24 hr tablet; Take  1 tablet (300 mg total) by mouth daily. -     escitalopram (LEXAPRO) 20 MG tablet; Take 1 tablet (20 mg total) by mouth daily.  Panic attacks -     prazosin (MINIPRESS) 1 MG capsule; Take 1 capsule (1 mg total) by mouth 2 (two) times daily. -     ALPRAZolam (XANAX) 0.5 MG tablet; Take 1 tablet (0.5 mg total) by mouth 2 (two) times daily as needed for anxiety.     Please see After Visit Summary for patient specific instructions.  Future Appointments  Date Time Provider Department Center  11/25/2019  8:00 AM Karie Kirks [facility] None  12/21/2019  8:00 AM Myles Lipps, MD PCP-PCP [facility]  12/28/2019 11:00 AM Cottle, Steva Ready., MD CP-CP None    No orders of the defined types were placed in this encounter.   -------------------------------

## 2019-11-25 ENCOUNTER — Ambulatory Visit (INDEPENDENT_AMBULATORY_CARE_PROVIDER_SITE_OTHER): Payer: 59 | Admitting: Psychology

## 2019-11-25 DIAGNOSIS — F330 Major depressive disorder, recurrent, mild: Secondary | ICD-10-CM

## 2019-11-25 DIAGNOSIS — F4310 Post-traumatic stress disorder, unspecified: Secondary | ICD-10-CM | POA: Diagnosis not present

## 2019-11-25 DIAGNOSIS — F4322 Adjustment disorder with anxiety: Secondary | ICD-10-CM | POA: Diagnosis not present

## 2019-12-07 ENCOUNTER — Ambulatory Visit (INDEPENDENT_AMBULATORY_CARE_PROVIDER_SITE_OTHER): Payer: 59 | Admitting: Psychology

## 2019-12-07 DIAGNOSIS — F4310 Post-traumatic stress disorder, unspecified: Secondary | ICD-10-CM

## 2019-12-07 DIAGNOSIS — F330 Major depressive disorder, recurrent, mild: Secondary | ICD-10-CM | POA: Diagnosis not present

## 2019-12-07 DIAGNOSIS — F4322 Adjustment disorder with anxiety: Secondary | ICD-10-CM | POA: Diagnosis not present

## 2019-12-15 ENCOUNTER — Ambulatory Visit (INDEPENDENT_AMBULATORY_CARE_PROVIDER_SITE_OTHER): Payer: 59 | Admitting: Psychology

## 2019-12-15 DIAGNOSIS — F4310 Post-traumatic stress disorder, unspecified: Secondary | ICD-10-CM | POA: Diagnosis not present

## 2019-12-15 DIAGNOSIS — F330 Major depressive disorder, recurrent, mild: Secondary | ICD-10-CM | POA: Diagnosis not present

## 2019-12-15 DIAGNOSIS — F4322 Adjustment disorder with anxiety: Secondary | ICD-10-CM | POA: Diagnosis not present

## 2019-12-21 ENCOUNTER — Encounter: Payer: Self-pay | Admitting: Family Medicine

## 2019-12-21 ENCOUNTER — Other Ambulatory Visit: Payer: Self-pay

## 2019-12-21 ENCOUNTER — Ambulatory Visit: Payer: 59 | Admitting: Family Medicine

## 2019-12-21 VITALS — BP 131/88 | HR 88 | Temp 98.1°F | Resp 18 | Ht 67.0 in | Wt 249.4 lb

## 2019-12-21 DIAGNOSIS — I10 Essential (primary) hypertension: Secondary | ICD-10-CM

## 2019-12-21 DIAGNOSIS — E559 Vitamin D deficiency, unspecified: Secondary | ICD-10-CM | POA: Diagnosis not present

## 2019-12-21 DIAGNOSIS — F418 Other specified anxiety disorders: Secondary | ICD-10-CM | POA: Diagnosis not present

## 2019-12-21 MED ORDER — HYDROCHLOROTHIAZIDE 25 MG PO TABS: 25.0000 mg | ORAL_TABLET | Freq: Every day | ORAL | 1 refills | Status: DC

## 2019-12-21 MED ORDER — LOSARTAN POTASSIUM 50 MG PO TABS: 50.0000 mg | ORAL_TABLET | Freq: Every day | ORAL | 1 refills | Status: DC

## 2019-12-21 NOTE — Patient Instructions (Signed)
° ° ° °  If you have lab work done today you will be contacted with your lab results within the next 2 weeks.  If you have not heard from us then please contact us. The fastest way to get your results is to register for My Chart. ° ° °IF you received an x-ray today, you will receive an invoice from [facility]. Please contact [facility] at [phone] with questions or concerns regarding your invoice.  ° °IF you received labwork today, you will receive an invoice from [facility]. Please contact [facility] at [phone] with questions or concerns regarding your invoice.  ° °Our billing staff will not be able to assist you with questions regarding bills from these companies. ° °You will be contacted with the lab results as soon as they are available. The fastest way to get your results is to activate your My Chart account. Instructions are located on the last page of this paperwork. If you have not heard from us regarding the results in 2 weeks, please contact this office. °  ° ° ° °

## 2019-12-21 NOTE — Progress Notes (Signed)
8/17/20218:21 AM  Maria Morse Jul 11, 1975, 44 y.o., female [id number]  Chief Complaint  Patient presents with  . Follow-up    Patient is in office for a 6 week follow up on Depression PHQ9=10. PAtient has no other questions or concerns at this time.    HPI:   Patient is a 44 y.o. female with past medical history significant for HTN, chronic fatigue,depression,PTSD and vit D deficiencywho presents today for routine followup  Last Ov July 2021 - no changes  [facility] Doing much better on wellbutrin and lexapro She continues to use xanax as needed for increased anxiety 2-3 x week. Over past couple of weeks, has not had panic attacks She continues to take prazosin Back at work, under several corrective actions, has involved labor board She cont to work with therapist weekly  She continues to take her BP meds daily She continues to work towards more regular exercise Takes vitamin D 2000 units a day, increases to 5000 units during winter  Reviewed labs from July 2021     Depression screen [facility] 2/9 12/21/2019 11/05/2019 10/15/2019  Decreased Interest 1 2 3   Down, Depressed, Hopeless 1 2 3   PHQ - 2 Score 2 4 6   Altered sleeping 2 2 3   Tired, decreased energy 2 2 3   Change in appetite 2 2 3   Feeling bad or failure about yourself  1 2 3   Trouble concentrating 1 2 3   Moving slowly or fidgety/restless 0 2 2  Suicidal thoughts 0 0 -  PHQ-9 Score 10 16 23   Difficult doing work/chores Somewhat difficult Very difficult Extremely dIfficult  Some recent data might be hidden    Fall Risk  12/21/2019 11/05/2019 10/15/2019 09/28/2019 09/17/2019  Falls in the past year? 0 0 0 0 0  Number falls in past yr: 0 0 0 0 0  Injury with Fall? 0 0 0 0 0  Follow up Falls evaluation completed Falls evaluation completed Falls evaluation completed Falls evaluation completed Falls evaluation completed     No Known Allergies  Prior to Admission medications   Medication Sig  Start Date End Date Taking? Authorizing Provider  ALPRAZolam ) 0.5 MG tablet Take 1 tablet (0.5 mg total) by mouth 2 (two) times daily as needed for anxiety. 11/24/19  Yes Mozingo, , NP  buPROPion (WELLBUTRIN XL) 300 MG 24 hr tablet Take 1 tablet (300 mg total) by mouth daily. 11/24/19  Yes Mozingo, 12/23/2019, NP  cetirizine (ZYRTEC) 10 MG tablet Take 10 mg by mouth daily.    Yes [provider]  cyclobenzaprine (FLEXERIL) 10 MG tablet Take 1 tablet (10 mg total) by mouth 3 (three) times daily as needed for muscle spasms. 05/21/19  Yes 12/15/2019, MD  escitalopram (LEXAPRO) 20 MG tablet Take 1 tablet (20 mg total) by mouth daily. 11/24/19  Yes Mozingo, 09/19/2019, NP  fluticasone (FLONASE) 50 MCG/ACT nasal spray Place 1 spray into both nostrils 2 (two) times daily as needed for allergies. 11/05/19  Yes 11/26/19, MD  hydrochlorothiazide (HYDRODIURIL) 25 MG tablet TAKE ONE TABLET BY MOUTH DAILY 07/19/19  Yes 11/26/19, MD  hydrocortisone (PROCTOSOL HC) 2.5 % rectal cream Place 1 application rectally 2 (two) times daily. 06/24/19  Yes 05/23/19, MD  losartan (COZAAR) 50 MG tablet Take 1 tablet (50 mg total) by mouth daily. 10/15/19  Yes 11/26/19, MD  meloxicam (MOBIC) 15 MG tablet Take 1 tablet (15 mg total)  by mouth daily. 05/21/19  Yes Myles Lipps, MD  prazosin (MINIPRESS) 1 MG capsule Take 1 capsule (1 mg total) by mouth 2 (two) times daily. 11/24/19  Yes Mozingo, Thereasa Solo, NP    Past Medical History:  Diagnosis Date  . Anxiety   . Chronic headaches   . Depression   . Hyperlipidemia   . Hypertension   . PTSD (post-traumatic stress disorder) 2007    No past surgical history on file.  Social History   Tobacco Use  . Smoking status: Former Smoker    Quit date: 05/06/1998    Years since quitting: 21.6  . Smokeless tobacco: Never Used  Substance Use Topics  . Alcohol use: Never    Family History  Problem  Relation Age of Onset  . Healthy Mother   . Diverticulitis Mother   . Hypertension Father   . Hyperlipidemia Father   . Healthy Sister   . Healthy Brother   . Stomach cancer Paternal Aunt     Review of Systems  Constitutional: Negative for chills and fever.  Respiratory: Negative for cough and shortness of breath.   Cardiovascular: Negative for chest pain, palpitations and leg swelling.  Gastrointestinal: Negative for abdominal pain, nausea and vomiting.   per hpi   OBJECTIVE:  Today's Vitals   12/21/19 0812 12/21/19 0813  BP: 138/90 131/88  Pulse: 88 88  Resp: 18   Temp: 98.1 F (36.7 C)   TempSrc: Temporal   SpO2: 97%   Weight: 249 lb 6.4 oz (113.1 kg)   Height: 5\' 7"  (1.702 m)    Body mass index is 39.06 kg/m.   Physical Exam Vitals and nursing note reviewed.  Constitutional:      Appearance: She is well-developed.  HENT:     Head: Normocephalic and atraumatic.  Eyes:     General: No scleral icterus.    Conjunctiva/sclera: Conjunctivae normal.     Pupils: Pupils are equal, round, and reactive to light.  Pulmonary:     Effort: Pulmonary effort is normal.  Musculoskeletal:     Cervical back: Neck supple.  Skin:    General: Skin is warm and dry.  Neurological:     Mental Status: She is alert and oriented to person, place, and time.     No results found for this or any previous visit (from the past 24 hour(s)).  No results found.   ASSESSMENT and PLAN  1. Essential hypertension, benign Controlled. Continue current regime.   2. Vitamin D deficiency Controlled. Continue current regime.   3. Mixed anxiety depressive disorder Much improved. Managed by psych.   Other orders - losartan (COZAAR) 50 MG tablet; Take 1 tablet (50 mg total) by mouth daily. - hydrochlorothiazide (HYDRODIURIL) 25 MG tablet; Take 1 tablet (25 mg total) by mouth daily.  Return in about 6 months (around 06/22/2020).    06/24/2020, MD Primary Care at [facility] [address] [place], [place] Ph.  [phone] Fax [phone]

## 2019-12-22 ENCOUNTER — Ambulatory Visit (INDEPENDENT_AMBULATORY_CARE_PROVIDER_SITE_OTHER): Payer: 59 | Admitting: Psychology

## 2019-12-22 DIAGNOSIS — F330 Major depressive disorder, recurrent, mild: Secondary | ICD-10-CM

## 2019-12-22 DIAGNOSIS — F4310 Post-traumatic stress disorder, unspecified: Secondary | ICD-10-CM

## 2019-12-22 DIAGNOSIS — F4322 Adjustment disorder with anxiety: Secondary | ICD-10-CM | POA: Diagnosis not present

## 2019-12-28 ENCOUNTER — Ambulatory Visit (INDEPENDENT_AMBULATORY_CARE_PROVIDER_SITE_OTHER): Payer: 59 | Admitting: Psychiatry

## 2019-12-28 ENCOUNTER — Other Ambulatory Visit: Payer: Self-pay

## 2019-12-28 ENCOUNTER — Encounter: Payer: Self-pay | Admitting: Psychiatry

## 2019-12-28 DIAGNOSIS — F410 Panic disorder [episodic paroxysmal anxiety] without agoraphobia: Secondary | ICD-10-CM | POA: Diagnosis not present

## 2019-12-28 DIAGNOSIS — F4310 Post-traumatic stress disorder, unspecified: Secondary | ICD-10-CM

## 2019-12-28 DIAGNOSIS — F411 Generalized anxiety disorder: Secondary | ICD-10-CM

## 2019-12-28 DIAGNOSIS — F331 Major depressive disorder, recurrent, moderate: Secondary | ICD-10-CM

## 2019-12-28 DIAGNOSIS — F3281 Premenstrual dysphoric disorder: Secondary | ICD-10-CM

## 2019-12-28 MED ORDER — ESCITALOPRAM OXALATE 20 MG PO TABS: ORAL_TABLET | ORAL | 1 refills | Status: DC

## 2019-12-28 MED ORDER — BUPROPION HCL ER (XL) 150 MG PO TB24: 450.0000 mg | ORAL_TABLET | Freq: Every day | ORAL | 1 refills | Status: DC
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
## 2019-12-29 ENCOUNTER — Ambulatory Visit (INDEPENDENT_AMBULATORY_CARE_PROVIDER_SITE_OTHER): Payer: 59 | Admitting: Psychology

## 2019-12-29 DIAGNOSIS — F330 Major depressive disorder, recurrent, mild: Secondary | ICD-10-CM

## 2019-12-29 DIAGNOSIS — F4322 Adjustment disorder with anxiety: Secondary | ICD-10-CM | POA: Diagnosis not present

## 2019-12-29 DIAGNOSIS — F4310 Post-traumatic stress disorder, unspecified: Secondary | ICD-10-CM | POA: Diagnosis not present

## 2020-01-03 ENCOUNTER — Telehealth: Payer: Self-pay | Admitting: Psychiatry

## 2020-01-03 NOTE — Telephone Encounter (Signed)
Yes she does have 2 on file. They are going to fill it for her.

## 2020-01-03 NOTE — Telephone Encounter (Signed)
Noted thank you

## 2020-01-03 NOTE — Telephone Encounter (Signed)
Maria Morse, can you check if they have these on file? They were sent 11/24/19 with 2 refills.

## 2020-01-03 NOTE — Telephone Encounter (Signed)
Pt called and needs a refill on Alprazolam. She has a day left. Call to [facility] on [address]

## 2020-01-04 ENCOUNTER — Ambulatory Visit: Payer: 59 | Admitting: Psychology

## 2020-01-17 ENCOUNTER — Ambulatory Visit (INDEPENDENT_AMBULATORY_CARE_PROVIDER_SITE_OTHER): Payer: 59 | Admitting: Psychology

## 2020-01-17 DIAGNOSIS — F330 Major depressive disorder, recurrent, mild: Secondary | ICD-10-CM | POA: Diagnosis not present

## 2020-01-17 DIAGNOSIS — F4322 Adjustment disorder with anxiety: Secondary | ICD-10-CM | POA: Diagnosis not present

## 2020-01-17 DIAGNOSIS — F4310 Post-traumatic stress disorder, unspecified: Secondary | ICD-10-CM | POA: Diagnosis not present

## 2020-01-31 ENCOUNTER — Ambulatory Visit (INDEPENDENT_AMBULATORY_CARE_PROVIDER_SITE_OTHER): Payer: 59 | Admitting: Psychology

## 2020-01-31 DIAGNOSIS — F330 Major depressive disorder, recurrent, mild: Secondary | ICD-10-CM

## 2020-01-31 DIAGNOSIS — F4310 Post-traumatic stress disorder, unspecified: Secondary | ICD-10-CM

## 2020-01-31 DIAGNOSIS — F4322 Adjustment disorder with anxiety: Secondary | ICD-10-CM

## 2020-02-08 ENCOUNTER — Other Ambulatory Visit: Payer: Self-pay | Admitting: Psychiatry

## 2020-02-08 DIAGNOSIS — F411 Generalized anxiety disorder: Secondary | ICD-10-CM

## 2020-02-08 DIAGNOSIS — F4310 Post-traumatic stress disorder, unspecified: Secondary | ICD-10-CM

## 2020-02-08 DIAGNOSIS — F331 Major depressive disorder, recurrent, moderate: Secondary | ICD-10-CM

## 2020-02-14 ENCOUNTER — Ambulatory Visit: Payer: 59 | Admitting: Psychology

## 2020-02-29 ENCOUNTER — Ambulatory Visit (INDEPENDENT_AMBULATORY_CARE_PROVIDER_SITE_OTHER): Payer: Self-pay | Admitting: Psychiatry

## 2020-02-29 ENCOUNTER — Encounter: Payer: Self-pay | Admitting: Psychiatry

## 2020-02-29 ENCOUNTER — Other Ambulatory Visit: Payer: Self-pay

## 2020-02-29 DIAGNOSIS — F4310 Post-traumatic stress disorder, unspecified: Secondary | ICD-10-CM

## 2020-02-29 DIAGNOSIS — F411 Generalized anxiety disorder: Secondary | ICD-10-CM

## 2020-02-29 DIAGNOSIS — R5382 Chronic fatigue, unspecified: Secondary | ICD-10-CM

## 2020-02-29 DIAGNOSIS — F3281 Premenstrual dysphoric disorder: Secondary | ICD-10-CM

## 2020-02-29 DIAGNOSIS — F410 Panic disorder [episodic paroxysmal anxiety] without agoraphobia: Secondary | ICD-10-CM

## 2020-02-29 DIAGNOSIS — F331 Major depressive disorder, recurrent, moderate: Secondary | ICD-10-CM

## 2020-02-29 DIAGNOSIS — G9332 Myalgic encephalomyelitis/chronic fatigue syndrome: Secondary | ICD-10-CM

## 2020-02-29 MED ORDER — ARIPIPRAZOLE 5 MG PO TABS: 5.0000 mg | ORAL_TABLET | Freq: Every day | ORAL | 1 refills | Status: DC
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
## 2020-03-11 ENCOUNTER — Other Ambulatory Visit: Payer: Self-pay | Admitting: Psychiatry

## 2020-03-11 DIAGNOSIS — F4310 Post-traumatic stress disorder, unspecified: Secondary | ICD-10-CM

## 2020-03-11 DIAGNOSIS — F411 Generalized anxiety disorder: Secondary | ICD-10-CM

## 2020-03-11 DIAGNOSIS — F331 Major depressive disorder, recurrent, moderate: Secondary | ICD-10-CM

## 2020-04-14 ENCOUNTER — Other Ambulatory Visit: Payer: Self-pay | Admitting: Psychiatry

## 2020-04-14 DIAGNOSIS — F4310 Post-traumatic stress disorder, unspecified: Secondary | ICD-10-CM

## 2020-04-14 DIAGNOSIS — F411 Generalized anxiety disorder: Secondary | ICD-10-CM

## 2020-04-14 DIAGNOSIS — F331 Major depressive disorder, recurrent, moderate: Secondary | ICD-10-CM

## 2020-04-15 ENCOUNTER — Other Ambulatory Visit: Payer: Self-pay | Admitting: Psychiatry

## 2020-04-15 DIAGNOSIS — F331 Major depressive disorder, recurrent, moderate: Secondary | ICD-10-CM

## 2020-04-17 ENCOUNTER — Other Ambulatory Visit: Payer: Self-pay | Admitting: Adult Health

## 2020-04-17 DIAGNOSIS — F4310 Post-traumatic stress disorder, unspecified: Secondary | ICD-10-CM

## 2020-04-17 DIAGNOSIS — F410 Panic disorder [episodic paroxysmal anxiety] without agoraphobia: Secondary | ICD-10-CM

## 2020-04-26 ENCOUNTER — Encounter: Payer: Self-pay | Admitting: Psychiatry

## 2020-04-26 ENCOUNTER — Ambulatory Visit (INDEPENDENT_AMBULATORY_CARE_PROVIDER_SITE_OTHER): Payer: Self-pay | Admitting: Psychiatry

## 2020-04-26 ENCOUNTER — Other Ambulatory Visit: Payer: Self-pay

## 2020-04-26 DIAGNOSIS — G9332 Myalgic encephalomyelitis/chronic fatigue syndrome: Secondary | ICD-10-CM

## 2020-04-26 DIAGNOSIS — F411 Generalized anxiety disorder: Secondary | ICD-10-CM

## 2020-04-26 DIAGNOSIS — F4310 Post-traumatic stress disorder, unspecified: Secondary | ICD-10-CM

## 2020-04-26 DIAGNOSIS — F410 Panic disorder [episodic paroxysmal anxiety] without agoraphobia: Secondary | ICD-10-CM

## 2020-04-26 DIAGNOSIS — F331 Major depressive disorder, recurrent, moderate: Secondary | ICD-10-CM

## 2020-04-26 DIAGNOSIS — R5382 Chronic fatigue, unspecified: Secondary | ICD-10-CM

## 2020-04-26 DIAGNOSIS — F3281 Premenstrual dysphoric disorder: Secondary | ICD-10-CM

## 2020-04-26 MED ORDER — BUPROPION HCL ER (XL) 150 MG PO TB24: 450.0000 mg | ORAL_TABLET | Freq: Every day | ORAL | 2 refills | Status: DC

## 2020-04-26 MED ORDER — ARIPIPRAZOLE 5 MG PO TABS: 7.5000 mg | ORAL_TABLET | Freq: Every day | ORAL | 1 refills | Status: DC
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
## 2020-06-08 ENCOUNTER — Other Ambulatory Visit: Payer: Self-pay | Admitting: Psychiatry

## 2020-06-08 DIAGNOSIS — F331 Major depressive disorder, recurrent, moderate: Secondary | ICD-10-CM

## 2020-06-16 ENCOUNTER — Ambulatory Visit: Payer: Managed Care, Other (non HMO) | Admitting: Registered Nurse

## 2020-06-16 ENCOUNTER — Encounter: Payer: Self-pay | Admitting: Registered Nurse

## 2020-06-16 ENCOUNTER — Other Ambulatory Visit: Payer: Self-pay

## 2020-06-16 VITALS — BP 127/84 | HR 77 | Temp 98.0°F | Resp 18 | Ht 67.0 in | Wt 244.4 lb

## 2020-06-16 DIAGNOSIS — I10 Essential (primary) hypertension: Secondary | ICD-10-CM

## 2020-06-16 NOTE — Patient Instructions (Signed)
° ° ° °  If you have lab work done today you will be contacted with your lab results within the next 2 weeks.  If you have not heard from us then please contact us. The fastest way to get your results is to register for My Chart. ° ° °IF you received an x-ray today, you will receive an invoice from [facility]. Please contact [facility] at [phone] with questions or concerns regarding your invoice.  ° °IF you received labwork today, you will receive an invoice from [facility]. Please contact [facility] at [phone] with questions or concerns regarding your invoice.  ° °Our billing staff will not be able to assist you with questions regarding bills from these companies. ° °You will be contacted with the lab results as soon as they are available. The fastest way to get your results is to activate your My Chart account. Instructions are located on the last page of this paperwork. If you have not heard from us regarding the results in 2 weeks, please contact this office. °  ° ° ° °

## 2020-06-27 ENCOUNTER — Encounter: Payer: Self-pay | Admitting: Psychiatry

## 2020-06-27 ENCOUNTER — Ambulatory Visit (INDEPENDENT_AMBULATORY_CARE_PROVIDER_SITE_OTHER): Payer: Managed Care, Other (non HMO) | Admitting: Psychiatry

## 2020-06-27 ENCOUNTER — Other Ambulatory Visit: Payer: Self-pay

## 2020-06-27 DIAGNOSIS — R5382 Chronic fatigue, unspecified: Secondary | ICD-10-CM | POA: Diagnosis not present

## 2020-06-27 DIAGNOSIS — R69 Illness, unspecified: Secondary | ICD-10-CM | POA: Diagnosis not present

## 2020-06-27 DIAGNOSIS — G9332 Myalgic encephalomyelitis/chronic fatigue syndrome: Secondary | ICD-10-CM

## 2020-06-27 DIAGNOSIS — F4310 Post-traumatic stress disorder, unspecified: Secondary | ICD-10-CM

## 2020-06-27 DIAGNOSIS — F411 Generalized anxiety disorder: Secondary | ICD-10-CM | POA: Diagnosis not present

## 2020-06-27 DIAGNOSIS — F331 Major depressive disorder, recurrent, moderate: Secondary | ICD-10-CM

## 2020-06-27 DIAGNOSIS — F3281 Premenstrual dysphoric disorder: Secondary | ICD-10-CM | POA: Diagnosis not present

## 2020-06-27 DIAGNOSIS — F410 Panic disorder [episodic paroxysmal anxiety] without agoraphobia: Secondary | ICD-10-CM

## 2020-06-27 NOTE — Patient Instructions (Signed)
To help depression increase aripiprazole to 7.5 mg daily for 1 month.  If partial improvement then call If no improvement stop aripiprazole and start Rexulti 1 mg daily for 1 week and then if needed increase to 2 mg daily
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
## 2020-07-08 ENCOUNTER — Other Ambulatory Visit: Payer: Self-pay | Admitting: Psychiatry

## 2020-07-08 ENCOUNTER — Other Ambulatory Visit: Payer: Self-pay | Admitting: Adult Health

## 2020-07-08 DIAGNOSIS — F331 Major depressive disorder, recurrent, moderate: Secondary | ICD-10-CM

## 2020-07-08 DIAGNOSIS — F410 Panic disorder [episodic paroxysmal anxiety] without agoraphobia: Secondary | ICD-10-CM

## 2020-07-08 DIAGNOSIS — F4310 Post-traumatic stress disorder, unspecified: Secondary | ICD-10-CM

## 2020-07-10 ENCOUNTER — Other Ambulatory Visit: Payer: Self-pay | Admitting: Psychiatry

## 2020-07-10 DIAGNOSIS — F331 Major depressive disorder, recurrent, moderate: Secondary | ICD-10-CM

## 2020-07-10 MED ORDER — ARIPIPRAZOLE 5 MG PO TABS: 7.5000 mg | ORAL_TABLET | Freq: Every day | ORAL | 0 refills | Status: DC

## 2020-07-10 NOTE — Telephone Encounter (Signed)
Why does her directions say for 1 dose?

## 2020-07-10 NOTE — Telephone Encounter (Signed)
Check on refill °

## 2020-07-11 ENCOUNTER — Other Ambulatory Visit: Payer: Self-pay | Admitting: Psychiatry

## 2020-07-11 NOTE — Telephone Encounter (Signed)
Thanks

## 2020-07-11 NOTE — Telephone Encounter (Signed)
corrected

## 2020-07-13 ENCOUNTER — Other Ambulatory Visit: Payer: Self-pay | Admitting: Registered Nurse

## 2020-07-13 MED ORDER — HYDROCHLOROTHIAZIDE 25 MG PO TABS: 25.0000 mg | ORAL_TABLET | Freq: Every day | ORAL | 1 refills | Status: DC

## 2020-07-13 NOTE — Telephone Encounter (Signed)
Requested medication (s) are due for refill today - yes  Requested medication (s) are on the active medication list -yes  Future visit scheduled -yes  Last refill: 6 month ago  Notes to clinic: Request RF- transfer of care 06/16/20([facility])- no active protocol showing- but patient is current with appointment.   Requested Prescriptions  Pending Prescriptions Disp Refills   hydrochlorothiazide (HYDRODIURIL) 25 MG tablet 90 tablet 1    Sig: Take 1 tablet (25 mg total) by mouth daily.      There is no refill protocol information for this order        Requested Prescriptions  Pending Prescriptions Disp Refills   hydrochlorothiazide (HYDRODIURIL) 25 MG tablet 90 tablet 1    Sig: Take 1 tablet (25 mg total) by mouth daily.      There is no refill protocol information for this order

## 2020-07-13 NOTE — Telephone Encounter (Signed)
Copied from CRM [id number]. Topic: Quick Communication - Rx Refill/Question >> Jul 13, 2020  3:12 PM Jaquita Rector A wrote: Medication: hydrochlorothiazide (HYDRODIURIL) 25 MG tablet   Has the patient contacted their pharmacy? Yes.   (Agent: If no, request that the patient contact the pharmacy for the refill.) (Agent: If yes, when and what did the pharmacy advise?)  Preferred Pharmacy (with phone number or street name): [facility] [place], [place] - [address]  Phone:  [phone] Fax:  [phone]     Agent: Please be advised that RX refills may take up to 3 business days. We ask that you follow-up with your pharmacy.

## 2020-08-03 ENCOUNTER — Other Ambulatory Visit: Payer: Self-pay

## 2020-08-03 ENCOUNTER — Telehealth: Payer: Self-pay | Admitting: Psychiatry

## 2020-08-03 DIAGNOSIS — F411 Generalized anxiety disorder: Secondary | ICD-10-CM

## 2020-08-03 DIAGNOSIS — F4310 Post-traumatic stress disorder, unspecified: Secondary | ICD-10-CM

## 2020-08-03 DIAGNOSIS — F331 Major depressive disorder, recurrent, moderate: Secondary | ICD-10-CM

## 2020-08-03 MED ORDER — ESCITALOPRAM OXALATE 20 MG PO TABS: ORAL_TABLET | ORAL | 0 refills | Status: DC

## 2020-08-03 NOTE — Telephone Encounter (Signed)
Rx sent 

## 2020-08-03 NOTE — Telephone Encounter (Signed)
The instructions say: TAKE ONE TABLET BY MOUTH DAILY EXCEPT TAKE ONE AND ONE HALF TABLETS 6 DAYS A MONTH FOR PMDD AS DIRECTED

## 2020-08-03 NOTE — Telephone Encounter (Signed)
That's what Dr Jennelle Human previously ordered so I resent

## 2020-08-03 NOTE — Telephone Encounter (Signed)
Okay; thanks.

## 2020-08-03 NOTE — Telephone Encounter (Signed)
Maria Morse, I just wanted to clarify the quantity #33? Unless that's what Dr. Jennelle Human ordered?

## 2020-08-03 NOTE — Telephone Encounter (Signed)
Pt called and asked for a refill on her lexapro 200 mg. She was seen in february and he was suppose to have sent it in in February. Please send to the [facility] at [facility] on [address]

## 2020-08-07 ENCOUNTER — Other Ambulatory Visit: Payer: Self-pay | Admitting: Psychiatry

## 2020-08-07 DIAGNOSIS — F331 Major depressive disorder, recurrent, moderate: Secondary | ICD-10-CM

## 2020-08-07 DIAGNOSIS — F4310 Post-traumatic stress disorder, unspecified: Secondary | ICD-10-CM

## 2020-08-07 DIAGNOSIS — F411 Generalized anxiety disorder: Secondary | ICD-10-CM

## 2020-08-15 ENCOUNTER — Ambulatory Visit: Payer: Managed Care, Other (non HMO) | Admitting: Psychiatry

## 2020-08-24 ENCOUNTER — Encounter: Payer: Self-pay | Admitting: Psychiatry

## 2020-08-24 ENCOUNTER — Other Ambulatory Visit: Payer: Self-pay

## 2020-08-24 ENCOUNTER — Ambulatory Visit (INDEPENDENT_AMBULATORY_CARE_PROVIDER_SITE_OTHER): Payer: 59 | Admitting: Psychiatry

## 2020-08-24 DIAGNOSIS — F411 Generalized anxiety disorder: Secondary | ICD-10-CM

## 2020-08-24 DIAGNOSIS — F331 Major depressive disorder, recurrent, moderate: Secondary | ICD-10-CM

## 2020-08-24 DIAGNOSIS — F4310 Post-traumatic stress disorder, unspecified: Secondary | ICD-10-CM | POA: Diagnosis not present

## 2020-08-24 DIAGNOSIS — F3281 Premenstrual dysphoric disorder: Secondary | ICD-10-CM

## 2020-08-24 DIAGNOSIS — R5382 Chronic fatigue, unspecified: Secondary | ICD-10-CM | POA: Diagnosis not present

## 2020-08-24 DIAGNOSIS — G9332 Myalgic encephalomyelitis/chronic fatigue syndrome: Secondary | ICD-10-CM

## 2020-08-24 DIAGNOSIS — F410 Panic disorder [episodic paroxysmal anxiety] without agoraphobia: Secondary | ICD-10-CM

## 2020-08-24 DIAGNOSIS — R69 Illness, unspecified: Secondary | ICD-10-CM | POA: Diagnosis not present

## 2020-08-24 MED ORDER — MODAFINIL 200 MG PO TABS: ORAL_TABLET | ORAL | 0 refills | Status: DC
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
## 2020-08-30 IMAGING — DX PORTABLE CHEST - 1 VIEW
1 series · 1 of 1 positions shown · non-contrast
Comparison: No recent prior.

CLINICAL DATA: Flu-like symptoms.

EXAM:
PORTABLE CHEST 1 VIEW

[chest ap]
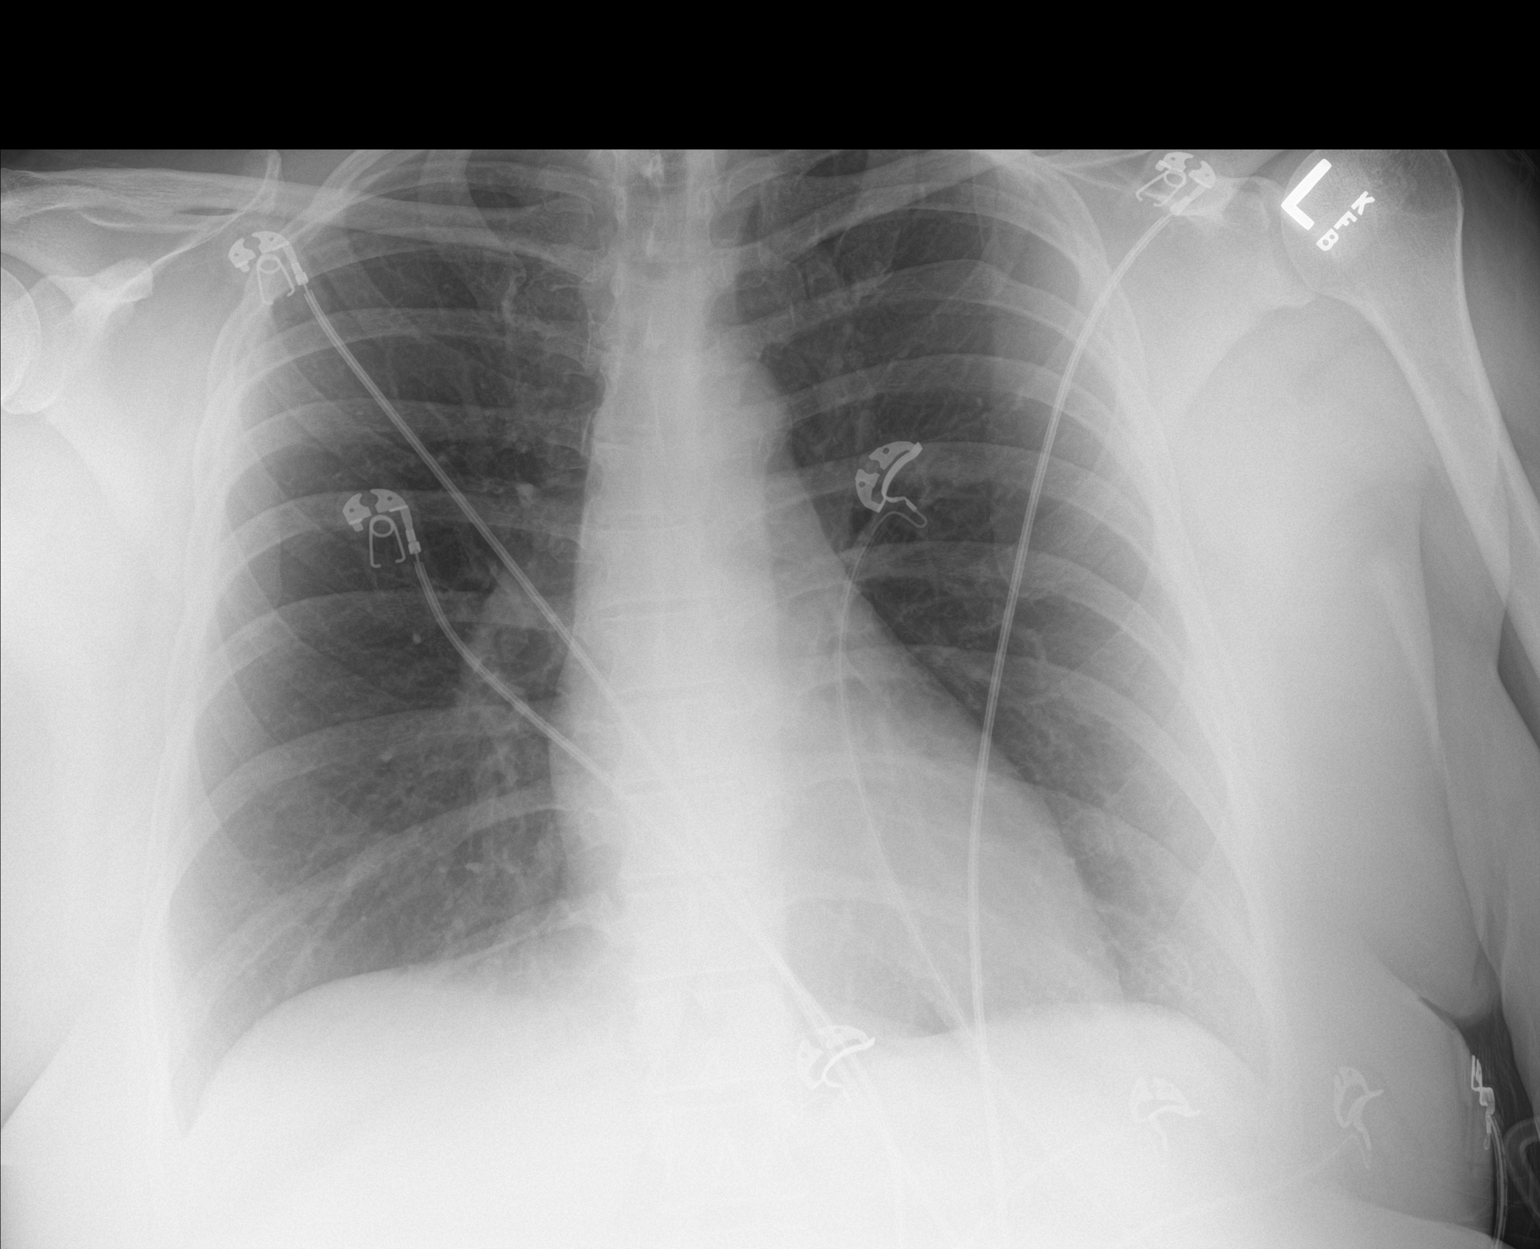

[1 of 1 positions shown; findings below may reference images not displayed]

FINDINGS: Mediastinum and hilar structures normal. Lungs are clear. No focal
infiltrate. No pleural effusion or pneumothorax. Heart size normal.
No acute bony abnormality.
IMPRESSION: No acute cardiopulmonary disease.

## 2020-09-05 ENCOUNTER — Ambulatory Visit: Payer: Self-pay | Admitting: *Deleted

## 2020-09-05 NOTE — Telephone Encounter (Signed)
Pt called in after her home covid test came up positive this morning.   Symptoms started last night and worse this morning when she woke up.  She used to go to Primary Care at [facility] which closed 07/28/2020 so right now she doesn't have a PCP.  I went over the care advice and quarantine protocol with her.   Answered her questions.  I instructed her to get medical attention if she developed shortness of breath or chest tightness.   She verbalized understanding and stated she would go to the urgent care if she felt she needed to see a doctor.    Reason for Disposition . [1] [id number] diagnosed by positive lab test (e.g., PCR, rapid self-test kit) AND [2] mild symptoms (e.g., cough, fever, others) AND [2] no complications or SOB  Answer Assessment - Initial Assessment Questions 1. COVID-19 DIAGNOSIS: "Who made your COVID-19 diagnosis?" "Was it confirmed by a positive lab test or self-test?" If not diagnosed by a doctor (or NP/PA), ask "Are there lots of cases (community spread) where you live?" Note: See public health department website, if unsure.     Home test is positive this morning   I'm having symptoms starting last night.  This morning I feel really bad.    2. COVID-19 EXPOSURE: "Was there any known exposure to COVID before the symptoms began?" CDC Definition of close contact: within 6 feet (2 meters) for a total of 15 minutes or more over a 24-hour period.      Not that I know of.   3. ONSET: "When did the COVID-19 symptoms start?"      Last night 4. WORST SYMPTOM: "What is your worst symptom?" (e.g., cough, fever, shortness of breath, muscle aches)     Chills, body aches 5. COUGH: "Do you have a cough?" If Yes, ask: "How bad is the cough?"       No 6. FEVER: "Do you have a fever?" If Yes, ask: "What is your temperature, how was it measured, and when did it start?"     Having chills and body aches 7. RESPIRATORY STATUS: "Describe your breathing?" (e.g., shortness of breath, wheezing,  unable to speak)      No shortness of breath 8. BETTER-SAME-WORSE: "Are you getting better, staying the same or getting worse compared to yesterday?"  If getting worse, ask, "In what way?"     Worse this morning.   Just started having symptoms last night 9. HIGH RISK DISEASE: "Do you have any chronic medical problems?" (e.g., asthma, heart or lung disease, weak immune system, obesity, etc.)     No other than hypertension which is controlled with medication. 10. VACCINE: "Have you had the COVID-19 vaccine?" If Yes, ask: "Which one, how many shots, when did you get it?"       Not asked 11. BOOSTER: "Have you received your COVID-19 booster?" If Yes, ask: "Which one and when did you get it?"       Not asked 12. PREGNANCY: "Is there any chance you are pregnant?" "When was your last menstrual period?"       Not asked 13. OTHER SYMPTOMS: "Do you have any other symptoms?"  (e.g., chills, fatigue, headache, loss of smell or taste, muscle pain, sore throat)       Runny nose, sore throat, fatigue, body aches, probably fever.  Vomited a little this morning.   No diarrhea 14. O2 SATURATION MONITOR:  "Do you use an oxygen saturation monitor (pulse oximeter) at home?" If Yes,  ask "What is your reading (oxygen level) today?" "What is your usual oxygen saturation reading?" (e.g., 95%)       No  Protocols used: CORONAVIRUS (COVID-19) DIAGNOSED OR SUSPECTED-A-AH

## 2020-09-06 ENCOUNTER — Other Ambulatory Visit: Payer: Self-pay | Admitting: Psychiatry

## 2020-09-06 ENCOUNTER — Other Ambulatory Visit: Payer: Self-pay | Admitting: Adult Health

## 2020-09-06 DIAGNOSIS — F4310 Post-traumatic stress disorder, unspecified: Secondary | ICD-10-CM

## 2020-09-06 DIAGNOSIS — F411 Generalized anxiety disorder: Secondary | ICD-10-CM

## 2020-09-06 DIAGNOSIS — F410 Panic disorder [episodic paroxysmal anxiety] without agoraphobia: Secondary | ICD-10-CM

## 2020-09-06 DIAGNOSIS — F331 Major depressive disorder, recurrent, moderate: Secondary | ICD-10-CM

## 2020-09-29 ENCOUNTER — Other Ambulatory Visit: Payer: Self-pay | Admitting: Adult Health

## 2020-09-29 DIAGNOSIS — F331 Major depressive disorder, recurrent, moderate: Secondary | ICD-10-CM

## 2020-09-29 DIAGNOSIS — F4310 Post-traumatic stress disorder, unspecified: Secondary | ICD-10-CM

## 2020-09-29 DIAGNOSIS — F411 Generalized anxiety disorder: Secondary | ICD-10-CM

## 2020-11-01 ENCOUNTER — Other Ambulatory Visit: Payer: Self-pay | Admitting: Psychiatry

## 2020-11-14 ENCOUNTER — Encounter: Payer: Self-pay | Admitting: Psychiatry

## 2020-11-14 ENCOUNTER — Ambulatory Visit: Payer: 59 | Admitting: Psychiatry

## 2020-11-14 ENCOUNTER — Other Ambulatory Visit: Payer: Self-pay

## 2020-11-14 DIAGNOSIS — F3281 Premenstrual dysphoric disorder: Secondary | ICD-10-CM | POA: Diagnosis not present

## 2020-11-14 DIAGNOSIS — R5382 Chronic fatigue, unspecified: Secondary | ICD-10-CM

## 2020-11-14 DIAGNOSIS — F4310 Post-traumatic stress disorder, unspecified: Secondary | ICD-10-CM

## 2020-11-14 DIAGNOSIS — F331 Major depressive disorder, recurrent, moderate: Secondary | ICD-10-CM | POA: Diagnosis not present

## 2020-11-14 DIAGNOSIS — R69 Illness, unspecified: Secondary | ICD-10-CM | POA: Diagnosis not present

## 2020-11-14 DIAGNOSIS — G9332 Myalgic encephalomyelitis/chronic fatigue syndrome: Secondary | ICD-10-CM

## 2020-11-14 DIAGNOSIS — F410 Panic disorder [episodic paroxysmal anxiety] without agoraphobia: Secondary | ICD-10-CM | POA: Diagnosis not present

## 2020-11-14 DIAGNOSIS — F411 Generalized anxiety disorder: Secondary | ICD-10-CM

## 2020-11-14 MED ORDER — BUPROPION HCL ER (XL) 150 MG PO TB24
450.0000 mg | ORAL_TABLET | Freq: Every day | ORAL | 1 refills | Status: DC
Start: 2020-11-14 — End: 2021-06-04

## 2020-11-14 MED ORDER — ESCITALOPRAM OXALATE 20 MG PO TABS: ORAL_TABLET | ORAL | 0 refills | Status: DC

## 2020-11-14 MED ORDER — ARIPIPRAZOLE 15 MG PO TABS
7.5000 mg | ORAL_TABLET | Freq: Every day | ORAL | 1 refills | Status: DC
Start: 2020-11-14 — End: 2021-06-21

## 2020-11-14 MED ORDER — ALPRAZOLAM 0.5 MG PO TABS: 0.5000 mg | ORAL_TABLET | Freq: Two times a day (BID) | ORAL | 1 refills | Status: AC | PRN

## 2020-11-14 MED ORDER — LOSARTAN POTASSIUM 50 MG PO TABS: 50.0000 mg | ORAL_TABLET | Freq: Every day | ORAL | 0 refills | Status: DC

## 2020-11-14 MED ORDER — PRAZOSIN HCL 1 MG PO CAPS: ORAL_CAPSULE | ORAL | 1 refills | Status: DC
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
## 2020-11-14 NOTE — Patient Instructions (Signed)
Ozempic/Wegovy for weight loss

## 2020-11-30 DIAGNOSIS — E669 Obesity, unspecified: Secondary | ICD-10-CM | POA: Diagnosis not present

## 2020-11-30 DIAGNOSIS — E559 Vitamin D deficiency, unspecified: Secondary | ICD-10-CM | POA: Diagnosis not present

## 2020-11-30 DIAGNOSIS — I10 Essential (primary) hypertension: Secondary | ICD-10-CM | POA: Diagnosis not present

## 2020-12-05 ENCOUNTER — Other Ambulatory Visit: Payer: Self-pay | Admitting: Psychiatry

## 2020-12-06 ENCOUNTER — Other Ambulatory Visit: Payer: Self-pay | Admitting: Psychiatry

## 2020-12-10 ENCOUNTER — Other Ambulatory Visit: Payer: Self-pay | Admitting: Registered Nurse

## 2020-12-18 ENCOUNTER — Ambulatory Visit: Payer: Self-pay | Admitting: Registered Nurse

## 2021-01-01 ENCOUNTER — Other Ambulatory Visit: Payer: Self-pay | Admitting: Psychiatry

## 2021-01-04 ENCOUNTER — Other Ambulatory Visit: Payer: Self-pay | Admitting: Psychiatry

## 2021-01-25 ENCOUNTER — Other Ambulatory Visit: Payer: Self-pay | Admitting: Registered Nurse

## 2021-01-25 ENCOUNTER — Other Ambulatory Visit: Payer: Self-pay | Admitting: Psychiatry

## 2021-01-30 ENCOUNTER — Telehealth: Payer: Self-pay | Admitting: Psychiatry

## 2021-01-30 ENCOUNTER — Other Ambulatory Visit: Payer: Self-pay

## 2021-01-30 ENCOUNTER — Other Ambulatory Visit: Payer: Self-pay | Admitting: Psychiatry

## 2021-01-30 DIAGNOSIS — F410 Panic disorder [episodic paroxysmal anxiety] without agoraphobia: Secondary | ICD-10-CM

## 2021-01-30 DIAGNOSIS — F4310 Post-traumatic stress disorder, unspecified: Secondary | ICD-10-CM

## 2021-01-30 NOTE — Telephone Encounter (Signed)
Pended.

## 2021-01-30 NOTE — Telephone Encounter (Signed)
Next visit is 02/14/21. Requesting refill on Prazosin called to:  [facility] [id number] - [place], [place] - [address]  Phone:  [phone]  Fax:  [phone]

## 2021-02-02 ENCOUNTER — Other Ambulatory Visit: Payer: Self-pay | Admitting: Psychiatry

## 2021-02-02 ENCOUNTER — Other Ambulatory Visit: Payer: Self-pay

## 2021-02-02 DIAGNOSIS — F4310 Post-traumatic stress disorder, unspecified: Secondary | ICD-10-CM

## 2021-02-02 DIAGNOSIS — F410 Panic disorder [episodic paroxysmal anxiety] without agoraphobia: Secondary | ICD-10-CM

## 2021-02-02 MED ORDER — PRAZOSIN HCL 1 MG PO CAPS: ORAL_CAPSULE | ORAL | 1 refills | Status: DC

## 2021-02-14 ENCOUNTER — Ambulatory Visit: Payer: 59 | Admitting: Psychiatry

## 2021-02-14 ENCOUNTER — Encounter: Payer: Self-pay | Admitting: Psychiatry

## 2021-02-14 ENCOUNTER — Other Ambulatory Visit: Payer: Self-pay

## 2021-02-14 DIAGNOSIS — F331 Major depressive disorder, recurrent, moderate: Secondary | ICD-10-CM | POA: Diagnosis not present

## 2021-02-14 DIAGNOSIS — F3281 Premenstrual dysphoric disorder: Secondary | ICD-10-CM

## 2021-02-14 DIAGNOSIS — R69 Illness, unspecified: Secondary | ICD-10-CM | POA: Diagnosis not present

## 2021-02-14 DIAGNOSIS — F4310 Post-traumatic stress disorder, unspecified: Secondary | ICD-10-CM

## 2021-02-14 DIAGNOSIS — G9332 Myalgic encephalomyelitis/chronic fatigue syndrome: Secondary | ICD-10-CM | POA: Diagnosis not present

## 2021-02-14 DIAGNOSIS — F410 Panic disorder [episodic paroxysmal anxiety] without agoraphobia: Secondary | ICD-10-CM

## 2021-02-14 DIAGNOSIS — F411 Generalized anxiety disorder: Secondary | ICD-10-CM | POA: Diagnosis not present

## 2021-02-14 MED ORDER — ESCITALOPRAM OXALATE 20 MG PO TABS: ORAL_TABLET | ORAL | 1 refills | Status: DC
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
## 2021-03-02 DIAGNOSIS — Z0000 Encounter for general adult medical examination without abnormal findings: Secondary | ICD-10-CM | POA: Diagnosis not present

## 2021-03-02 DIAGNOSIS — Z13220 Encounter for screening for lipoid disorders: Secondary | ICD-10-CM | POA: Diagnosis not present

## 2021-03-03 ENCOUNTER — Other Ambulatory Visit: Payer: Self-pay | Admitting: Psychiatry

## 2021-03-05 DIAGNOSIS — Z23 Encounter for immunization: Secondary | ICD-10-CM | POA: Diagnosis not present

## 2021-03-13 ENCOUNTER — Other Ambulatory Visit: Payer: Self-pay | Admitting: Psychiatry

## 2021-04-05 DIAGNOSIS — Z23 Encounter for immunization: Secondary | ICD-10-CM | POA: Diagnosis not present

## 2021-05-25 NOTE — Progress Notes (Signed)
Established Patient Office Visit  Subjective:  Patient ID: Maria Morse, female    DOB: 1975/08/18  Age: 46 y.o. MRN: [id number]  CC:  Chief Complaint  Patient presents with   Transitions Of Care    Patient states she is here for a TOC and medication refill for hypertension. Patient has no other concerns.    HPI Maria Morse presents for visit to est care  Formerly pt of Dr. Leretha Pol  Hypertension: Patient Currently taking: hctz 25mg  po qd Good effect. No AEs. Denies CV symptoms including: chest pain, shob, doe, headache, visual changes, fatigue, claudication, and dependent edema.   Previous readings and labs: BP Readings from Last 3 Encounters:  06/16/20 127/84  12/21/19 131/88  11/05/19 138/86   Lab Results  Component Value Date   CREATININE 0.98 11/05/2019    No other concerns.  Last labs reviewed  Past Medical History:  Diagnosis Date   Anxiety    Chronic headaches    Depression    Hyperlipidemia    Hypertension    PTSD (post-traumatic stress disorder) 2007    History reviewed. No pertinent surgical history.  Family History  Problem Relation Age of Onset   Healthy Mother    Diverticulitis Mother    Hypertension Father    Hyperlipidemia Father    Healthy Sister    Healthy Brother    Stomach cancer Paternal Aunt     Social History   Socioeconomic History   Marital status: Single    Spouse name: Not on file   Number of children: Not on file   Years of education: Not on file   Highest education level: Not on file  Occupational History   Occupation: consulting  Tobacco Use   Smoking status: Former    Types: Cigarettes    Quit date: 05/06/1998    Years since quitting: 23.0   Smokeless tobacco: Never  Vaping Use   Vaping Use: Never used  Substance and Sexual Activity   Alcohol use: Never   Drug use: Never   Sexual activity: Not Currently  Other Topics Concern   Not on file  Social History Narrative   Not on file   Social  Determinants of Health   Financial Resource Strain: Not on file  Food Insecurity: Not on file  Transportation Needs: Not on file  Physical Activity: Not on file  Stress: Not on file  Social Connections: Not on file  Intimate Partner Violence: Not on file    Outpatient Medications Prior to Visit  Medication Sig Dispense Refill   cetirizine (ZYRTEC) 10 MG tablet Take 10 mg by mouth daily.      fluticasone (FLONASE) 50 MCG/ACT nasal spray Place 1 spray into both nostrils 2 (two) times daily as needed for allergies. 16 g 5   hydrocortisone (PROCTOSOL HC) 2.5 % rectal cream Place 1 application rectally 2 (two) times daily. 28 g 2   ALPRAZolam (XANAX) 0.5 MG tablet Take 1 tablet (0.5 mg total) by mouth 2 (two) times daily as needed for anxiety. (Patient not taking: No sig reported) 60 tablet 2   ARIPiprazole (ABILIFY) 5 MG tablet TAKE 1 AND 1/2 TABLETS BY MOUTH DAILY (Patient taking differently: 1 tab daily.) 45 tablet 0   buPROPion (WELLBUTRIN XL) 150 MG 24 hr tablet Take 3 tablets (450 mg total) by mouth daily. 90 tablet 2   cyclobenzaprine (FLEXERIL) 10 MG tablet Take 1 tablet (10 mg total) by mouth 3 (three) times daily as needed for muscle spasms. (Patient  not taking: No sig reported) 30 tablet 0   escitalopram (LEXAPRO) 20 MG tablet TAKE ONE TABLET BY MOUTH DAILY EXCEPT TAKE ONE AND ONE HALF TABLETS 6 DAYS A MONTH FOR PMDD AS DIRECTED 33 tablet 0   hydrochlorothiazide (HYDRODIURIL) 25 MG tablet Take 1 tablet (25 mg total) by mouth daily. 90 tablet 1   losartan (COZAAR) 50 MG tablet Take 1 tablet (50 mg total) by mouth daily. 90 tablet 1   meloxicam (MOBIC) 15 MG tablet Take 1 tablet (15 mg total) by mouth daily. (Patient not taking: No sig reported) 30 tablet 0   prazosin (MINIPRESS) 1 MG capsule TAKE ONE CAPSULE BY MOUTH TWICE A DAY 60 capsule 2   No facility-administered medications prior to visit.    No Known Allergies  ROS Review of Systems  Constitutional: Negative.   HENT:  Negative.    Eyes: Negative.   Respiratory: Negative.    Cardiovascular: Negative.   Gastrointestinal: Negative.   Genitourinary: Negative.   Musculoskeletal: Negative.   Skin: Negative.   Neurological: Negative.   Psychiatric/Behavioral: Negative.    All other systems reviewed and are negative.    Objective:    Physical Exam Vitals and nursing note reviewed.  Constitutional:      General: She is not in acute distress.    Appearance: Normal appearance. She is normal weight. She is not ill-appearing, toxic-appearing or diaphoretic.  Cardiovascular:     Rate and Rhythm: Normal rate and regular rhythm.     Heart sounds: Normal heart sounds. No murmur heard.   No friction rub. No gallop.  Pulmonary:     Effort: Pulmonary effort is normal. No respiratory distress.     Breath sounds: Normal breath sounds. No stridor. No wheezing, rhonchi or rales.  Chest:     Chest wall: No tenderness.  Skin:    General: Skin is warm and dry.  Neurological:     General: No focal deficit present.     Mental Status: She is alert and oriented to person, place, and time. Mental status is at baseline.  Psychiatric:        Mood and Affect: Mood normal.        Behavior: Behavior normal.        Thought Content: Thought content normal.        Judgment: Judgment normal.    BP 127/84    Pulse 77    Temp 98 F (36.7 C) (Temporal)    Resp 18    Ht 5\' 7"  (1.702 m)    Wt 244 lb 6.4 oz (110.9 kg)    LMP 06/12/2020    SpO2 96%    BMI 38.28 kg/m  Wt Readings from Last 3 Encounters:  06/16/20 244 lb 6.4 oz (110.9 kg)  12/21/19 249 lb 6.4 oz (113.1 kg)  11/05/19 245 lb (111.1 kg)     Health Maintenance Due  Topic Date Due   Hepatitis C Screening  Never done   COVID-19 Vaccine (4 - Booster for Pfizer series) 04/25/2020   PAP SMEAR-Modifier  09/25/2020   INFLUENZA VACCINE  12/04/2020    There are no preventive care reminders to display for this patient.  Lab Results  Component Value Date   TSH  2.810 12/29/2018   Lab Results  Component Value Date   WBC 10.3 12/29/2018   HGB 12.1 12/29/2018   HCT 36.9 12/29/2018   MCV 85 12/29/2018   PLT 341 12/29/2018   Lab Results  Component Value Date  NA 142 11/05/2019   K 4.4 11/05/2019   CO2 23 11/05/2019   GLUCOSE 95 11/05/2019   BUN 13 11/05/2019   CREATININE 0.98 11/05/2019   BILITOT <0.2 11/05/2019   ALKPHOS 77 11/05/2019   AST 16 11/05/2019   ALT 20 11/05/2019   PROT 6.6 11/05/2019   ALBUMIN 4.2 11/05/2019   CALCIUM 9.3 11/05/2019   Lab Results  Component Value Date   CHOL 207 (H) 11/05/2019   Lab Results  Component Value Date   HDL 43 11/05/2019   Lab Results  Component Value Date   LDLCALC 131 (H) 11/05/2019   Lab Results  Component Value Date   TRIG 184 (H) 11/05/2019   Lab Results  Component Value Date   CHOLHDL 4.8 (H) 11/05/2019   Lab Results  Component Value Date   HGBA1C 5.4 11/05/2019      Assessment & Plan:   Problem List Items Addressed This Visit   None   No orders of the defined types were placed in this encounter.   Follow-up: No follow-ups on file.   PLAN Htn well controlled Return for CPE and labs within 6 mo Patient encouraged to call clinic with any questions, comments, or concerns.  Janeece Agee, NP

## 2021-06-03 ENCOUNTER — Other Ambulatory Visit: Payer: Self-pay | Admitting: Psychiatry

## 2021-06-03 DIAGNOSIS — F4310 Post-traumatic stress disorder, unspecified: Secondary | ICD-10-CM

## 2021-06-03 DIAGNOSIS — F411 Generalized anxiety disorder: Secondary | ICD-10-CM

## 2021-06-03 DIAGNOSIS — F331 Major depressive disorder, recurrent, moderate: Secondary | ICD-10-CM

## 2021-06-21 ENCOUNTER — Encounter: Payer: Self-pay | Admitting: Psychiatry

## 2021-06-21 ENCOUNTER — Other Ambulatory Visit: Payer: Self-pay

## 2021-06-21 ENCOUNTER — Ambulatory Visit: Payer: 59 | Admitting: Psychiatry

## 2021-06-21 DIAGNOSIS — F331 Major depressive disorder, recurrent, moderate: Secondary | ICD-10-CM

## 2021-06-21 DIAGNOSIS — F411 Generalized anxiety disorder: Secondary | ICD-10-CM

## 2021-06-21 DIAGNOSIS — F3281 Premenstrual dysphoric disorder: Secondary | ICD-10-CM

## 2021-06-21 DIAGNOSIS — F410 Panic disorder [episodic paroxysmal anxiety] without agoraphobia: Secondary | ICD-10-CM

## 2021-06-21 DIAGNOSIS — G9332 Myalgic encephalomyelitis/chronic fatigue syndrome: Secondary | ICD-10-CM | POA: Diagnosis not present

## 2021-06-21 DIAGNOSIS — F4310 Post-traumatic stress disorder, unspecified: Secondary | ICD-10-CM

## 2021-06-21 DIAGNOSIS — R69 Illness, unspecified: Secondary | ICD-10-CM | POA: Diagnosis not present

## 2021-06-21 MED ORDER — PRAZOSIN HCL 1 MG PO CAPS: ORAL_CAPSULE | ORAL | 1 refills | Status: DC

## 2021-06-21 MED ORDER — ESCITALOPRAM OXALATE 20 MG PO TABS: ORAL_TABLET | ORAL | 1 refills | Status: DC

## 2021-06-21 MED ORDER — ARIPIPRAZOLE 15 MG PO TABS: 7.5000 mg | ORAL_TABLET | Freq: Every day | ORAL | 1 refills | Status: DC

## 2021-06-21 MED ORDER — BUPROPION HCL ER (XL) 150 MG PO TB24: 450.0000 mg | ORAL_TABLET | Freq: Every day | ORAL | 1 refills | Status: DC
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
## 2021-06-21 NOTE — Patient Instructions (Signed)
(  [facility])  N-Acetylcysteine at 600 mg capsules 1-2 daily to help with mild cognitive problems.

## 2021-06-29 DIAGNOSIS — M25572 Pain in left ankle and joints of left foot: Secondary | ICD-10-CM | POA: Diagnosis not present

## 2021-07-03 ENCOUNTER — Other Ambulatory Visit: Payer: Self-pay | Admitting: Psychiatry

## 2021-09-02 ENCOUNTER — Other Ambulatory Visit: Payer: Self-pay | Admitting: Psychiatry

## 2021-09-02 DIAGNOSIS — F4310 Post-traumatic stress disorder, unspecified: Secondary | ICD-10-CM

## 2021-09-02 DIAGNOSIS — F411 Generalized anxiety disorder: Secondary | ICD-10-CM

## 2021-09-02 DIAGNOSIS — F331 Major depressive disorder, recurrent, moderate: Secondary | ICD-10-CM

## 2021-09-26 ENCOUNTER — Ambulatory Visit (INDEPENDENT_AMBULATORY_CARE_PROVIDER_SITE_OTHER): Payer: Commercial Managed Care - PPO | Admitting: Psychology

## 2021-09-26 DIAGNOSIS — F330 Major depressive disorder, recurrent, mild: Secondary | ICD-10-CM

## 2021-09-26 DIAGNOSIS — F4310 Post-traumatic stress disorder, unspecified: Secondary | ICD-10-CM | POA: Diagnosis not present
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
## 2021-10-03 ENCOUNTER — Other Ambulatory Visit: Payer: Self-pay | Admitting: Psychiatry

## 2021-10-03 DIAGNOSIS — F411 Generalized anxiety disorder: Secondary | ICD-10-CM

## 2021-10-03 DIAGNOSIS — F331 Major depressive disorder, recurrent, moderate: Secondary | ICD-10-CM

## 2021-10-03 DIAGNOSIS — F4310 Post-traumatic stress disorder, unspecified: Secondary | ICD-10-CM

## 2021-10-04 ENCOUNTER — Ambulatory Visit (INDEPENDENT_AMBULATORY_CARE_PROVIDER_SITE_OTHER): Payer: Commercial Managed Care - PPO | Admitting: Psychology

## 2021-10-04 DIAGNOSIS — F330 Major depressive disorder, recurrent, mild: Secondary | ICD-10-CM

## 2021-10-04 DIAGNOSIS — F4310 Post-traumatic stress disorder, unspecified: Secondary | ICD-10-CM | POA: Diagnosis not present

## 2021-10-04 NOTE — Progress Notes (Signed)
[facility] Counselor/Therapist Progress Note  Patient ID: Maria Morse, MRN: [id number],    Date: 10/04/2021  Time Spent: 45 mins  Treatment Type: Individual Therapy  Reported Symptoms: Pt presents for session via webex video.  Pt grants consent for session, stating that she is in her home with no one else present.  I shared with pt that I am in my office at home with no one else here either.    Mental Status Exam: Appearance:  Casual     Behavior: Appropriate  Motor: Normal  Speech/Language:  Clear and Coherent  Affect: Appropriate  Mood: normal  Thought process: normal  Thought content:   WNL  Sensory/Perceptual disturbances:   WNL  Orientation: oriented to person, place, and time/date  Attention: Good  Concentration: Good  Memory: WNL  Fund of knowledge:  Good  Insight:   Good  Judgment:  Good  Impulse Control: Good   Risk Assessment: Danger to Self:  No Self-injurious Behavior: No Danger to Others: No Duty to Warn:no Physical Aggression / Violence:No  Access to Firearms a concern: No  Gang Involvement:No   Subjective: Pt shares that she has had "an OK time at work this week.  She shares she injured her ankle at work back in Feb and it is still sore from time to time.  Pt shares she is going to her Recovery Conf in [place] today through the weekend.  She is looking forward to the conference and getting reconnected.  Talked with pt about how she did with the two items I asked her to consider between last week and this week.  She shares that she was able to shower three times this week and also got all of her dishes done.  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  Pt shares her mom continues to be thankful for pt's place in her recovery and in her life at this time.  Encouraged pt to try to be as open to people and opportunities for herself as she can be for the  conference this weekend.  Encouraged pt to be as connected and present as she can be this weekend.  She also got a massage for herself this past week as a means of self care. Encouraged pt to continue with the regular showering and keeping her apt clean and tidy between now and our follow up session next week.  Interventions: Cognitive Behavioral Therapy  Diagnosis:PTSD (post-traumatic stress disorder)  Mild episode of recurrent major depressive disorder (HCC)  Plan: Treatment Plan Strengths/Abilities:  Intelligent, Intuitive, Willing to participate in therapy Treatment Preferences:  Outpatient Individual Therapy Statement of Needs:  Patient is to use CBT, mindfulness and coping skills to help manage and/or decrease symptoms associated with their diagnosis. Symptoms:  Depressed/Irritable mood, worry, social withdrawal Problems Addressed:  Depressive thoughts, Sadness, Sleep issues, etc. Long Term Goals:  Pt to reduce overall level, frequency, and intensity of the feelings of depression/anxiety as evidenced by decreased irritability, negative self talk, and helpless feelings from 6 to 7 days/week to 0 to 1 days/week, per client report, for at least 3 consecutive months.  Progress: 10% Short Term Goals:  Pt to verbally express understanding of the relationship between feelings of depression/anxiety and their impact on thinking patterns and behaviors.  Pt to verbalize an understanding of the role that distorted thinking plays in creating fears, excessive worry, and ruminations.  Progress: 10% Target  Date:  10/05/2022 Frequency:  Bi-weekly Modality:  Cognitive Behavioral Therapy Interventions by Therapist:  Therapist will use CBT, Mindfulness exercises, Coping skills and Referrals, as needed by client. Client has verbally approved this treatment plan.  Karie Kirks, [facility]

## 2021-10-10 ENCOUNTER — Ambulatory Visit (INDEPENDENT_AMBULATORY_CARE_PROVIDER_SITE_OTHER): Payer: Commercial Managed Care - PPO | Admitting: Psychology

## 2021-10-10 DIAGNOSIS — F330 Major depressive disorder, recurrent, mild: Secondary | ICD-10-CM

## 2021-10-10 DIAGNOSIS — F4310 Post-traumatic stress disorder, unspecified: Secondary | ICD-10-CM | POA: Diagnosis not present

## 2021-10-10 NOTE — Progress Notes (Signed)
[facility] Counselor/Therapist Progress Note  Patient ID: Maria Morse, MRN: [id number],    Date: 10/10/2021  Time Spent: 45 mins  Treatment Type: Individual Therapy  Reported Symptoms: Pt presents for session via webex video.  Pt grants consent for session, stating that she is in her home with no one else present.  I shared with pt that I am in my office at home with no one else here either.    Mental Status Exam: Appearance:  Casual     Behavior: Appropriate  Motor: Normal  Speech/Language:  Clear and Coherent  Affect: Appropriate  Mood: normal  Thought process: normal  Thought content:   WNL  Sensory/Perceptual disturbances:   WNL  Orientation: oriented to person, place, and time/date  Attention: Good  Concentration: Good  Memory: WNL  Fund of knowledge:  Good  Insight:   Good  Judgment:  Good  Impulse Control: Good   Risk Assessment: Danger to Self:  No Self-injurious Behavior: No Danger to Others: No Duty to Warn:no Physical Aggression / Violence:No  Access to Firearms a concern: No  Gang Involvement:No   Subjective: Pt shares that she has had "a good trip to [place] for the recovery conference.  The trip to and from [place] was good."  Pt shares that she came away with a larger sense of God and his care of [place] and "getting out of his way so he can do his work."  Pt shares that her self care has included two showers and doing laundry since she got back home.  She has kept in the dishes pretty clean to this point.  Pt has been sleeping in on her days off; she has slept well.  Pt shares that it is hard for her to find motivation for getting out of bed and staying up once she gets up.  Talked about establishing schedules that include fun self care activities in her daily schedule as well.  She feels she is struggling more with her depression in the past few months.  Pt shares she is taking Wellbutrin 450 mg, Lexapro qhs, Abilify 7.5 mg q am.  Pt has a follow up  appt for her medication on 6/19.  Pt shares that her ankle continues to give her some difficulty.  Asked pt to work on a [employer] between now and our follow up session in one week.  Check with pt at next appt about future 12-step service commitments she has coming up.  Encouraged pt to continue with the regular showering and keeping her apt clean and tidy between now and our follow up session next week.  Interventions: Cognitive Behavioral Therapy  Diagnosis:PTSD (post-traumatic stress disorder)  Mild episode of recurrent major depressive disorder (HCC)  Plan: Treatment Plan Strengths/Abilities:  Intelligent, Intuitive, Willing to participate in therapy Treatment Preferences:  Outpatient Individual Therapy Statement of Needs:  Patient is to use CBT, mindfulness and coping skills to help manage and/or decrease symptoms associated with their diagnosis. Symptoms:  Depressed/Irritable mood, worry, social withdrawal Problems Addressed:  Depressive thoughts, Sadness, Sleep issues, etc. Long Term Goals:  Pt to reduce overall level, frequency, and intensity of the feelings of depression/anxiety as evidenced by decreased irritability, negative self talk, and helpless feelings from 6 to 7 days/week to 0 to 1 days/week, per client report, for at least 3 consecutive months.  Progress: 10% Short Term Goals:  Pt to verbally express understanding of the relationship between feelings of depression/anxiety and their impact on thinking patterns and behaviors.  Pt to verbalize an understanding of the role that distorted thinking plays in creating fears, excessive worry, and ruminations.  Progress: 10% Target Date:  10/05/2022 Frequency:  Bi-weekly Modality:  Cognitive Behavioral Therapy Interventions by Therapist:  Therapist will use CBT, Mindfulness exercises, Coping skills and Referrals, as needed by client. Client has verbally approved this treatment plan.  Karie Kirks, [facility]

## 2021-10-19 ENCOUNTER — Ambulatory Visit (INDEPENDENT_AMBULATORY_CARE_PROVIDER_SITE_OTHER): Payer: Commercial Managed Care - PPO | Admitting: Psychology

## 2021-10-19 DIAGNOSIS — F330 Major depressive disorder, recurrent, mild: Secondary | ICD-10-CM

## 2021-10-19 DIAGNOSIS — F4310 Post-traumatic stress disorder, unspecified: Secondary | ICD-10-CM

## 2021-10-19 NOTE — Progress Notes (Signed)
[facility] Counselor/Therapist Progress Note  Patient ID: Maria Morse, MRN: [id number],    Date: 10/19/2021  Time Spent: 45 mins  Treatment Type: Individual Therapy  Reported Symptoms: Pt presents for session via webex video.  Pt grants consent for session, stating that she is in her home with no one else present.  I shared with pt that I am in my office at home with no one else here either.    Mental Status Exam: Appearance:  Casual     Behavior: Appropriate  Motor: Normal  Speech/Language:  Clear and Coherent  Affect: Appropriate  Mood: normal  Thought process: normal  Thought content:   WNL  Sensory/Perceptual disturbances:   WNL  Orientation: oriented to person, place, and time/date  Attention: Good  Concentration: Good  Memory: WNL  Fund of knowledge:  Good  Insight:   Good  Judgment:  Good  Impulse Control: Good   Risk Assessment: Danger to Self:  No Self-injurious Behavior: No Danger to Others: No Duty to Warn:no Physical Aggression / Violence:No  Access to Firearms a concern: No  Gang Involvement:No   Subjective: Pt shares that she has "been working a lot this past week (worked Wed, Th, and is working Sat and Sun).  Talked with pt about the benefits of self care activities on her days off; encouraged pt to engage in at least one self care activity on her days off; more than one if possible.  Pt shares she has been going to a couple of mtgs each week "even though I did not want to, but I did get out and went."  Pt shares that her period has come and she struggles with that before and during the period; she does not have an OB/Gyn at this point and may want to find one.  Encouraged pt to talk with friends and coworkers about which doctors they see.  Pt shares that she talked with her mom last night and her mom has toured the [employer] and she may be interested in moving there, maybe in October.  Her mom's health is OK at this point but she does  have some mobility issues.  Pt shares that she is keeping up with her laundry and "has some dishes that I need to address."  Encouraged pt to take care of the dishes today and to take the time to be intentional about appreciating how she feels when she gets that task accomplished.  Encouraged pt to purposefully reflect on the conference she attended and think about the things she gained from it.  Pt shares she is taking Wellbutrin 450 mg, Lexapro qhs, Abilify 7.5 mg q am.  Pt has a follow up appt for her medication on 6/19.  Pt shares that her ankles continue to give her some difficulty.  Pt shares she has not "executed her Gratitude List yet";encouraged pt to give consideration to starting this as she has opportunity to do so.  Encouraged pt to continue with the regular showering (at least every other day) and keeping her apt clean and tidy between now and our follow up session next week.  Interventions: Cognitive Behavioral Therapy  Diagnosis:PTSD (post-traumatic stress disorder)  Mild episode of recurrent major depressive disorder (HCC)  Plan: Treatment Plan Strengths/Abilities:  Intelligent, Intuitive, Willing to participate in therapy Treatment Preferences:  Outpatient Individual Therapy Statement of Needs:  Patient is to use CBT, mindfulness and coping skills to help manage and/or decrease symptoms associated with their diagnosis. Symptoms:  Depressed/Irritable  mood, worry, social withdrawal Problems Addressed:  Depressive thoughts, Sadness, Sleep issues, etc. Long Term Goals:  Pt to reduce overall level, frequency, and intensity of the feelings of depression/anxiety as evidenced by decreased irritability, negative self talk, and helpless feelings from 6 to 7 days/week to 0 to 1 days/week, per client report, for at least 3 consecutive months.  Progress: 10% Short Term Goals:  Pt to verbally express understanding of the relationship between feelings of depression/anxiety and their impact on  thinking patterns and behaviors.  Pt to verbalize an understanding of the role that distorted thinking plays in creating fears, excessive worry, and ruminations.  Progress: 10% Target Date:  10/05/2022 Frequency:  Bi-weekly Modality:  Cognitive Behavioral Therapy Interventions by Therapist:  Therapist will use CBT, Mindfulness exercises, Coping skills and Referrals, as needed by client. Client has verbally approved this treatment plan.  Karie Kirks, [facility]

## 2021-10-22 ENCOUNTER — Ambulatory Visit (INDEPENDENT_AMBULATORY_CARE_PROVIDER_SITE_OTHER): Payer: Commercial Managed Care - PPO | Admitting: Psychiatry

## 2021-10-22 ENCOUNTER — Encounter: Payer: Self-pay | Admitting: Psychiatry

## 2021-10-22 DIAGNOSIS — F4310 Post-traumatic stress disorder, unspecified: Secondary | ICD-10-CM

## 2021-10-22 DIAGNOSIS — F410 Panic disorder [episodic paroxysmal anxiety] without agoraphobia: Secondary | ICD-10-CM

## 2021-10-22 DIAGNOSIS — F411 Generalized anxiety disorder: Secondary | ICD-10-CM

## 2021-10-22 DIAGNOSIS — F331 Major depressive disorder, recurrent, moderate: Secondary | ICD-10-CM

## 2021-10-22 DIAGNOSIS — G9332 Myalgic encephalomyelitis/chronic fatigue syndrome: Secondary | ICD-10-CM

## 2021-10-22 MED ORDER — AUVELITY 45-105 MG PO TBCR: 1.0000 | EXTENDED_RELEASE_TABLET | Freq: Two times a day (BID) | ORAL | 0 refills | Status: DC

## 2021-10-22 NOTE — Patient Instructions (Signed)
Reduce Wellbutrin to 1 daily and start Auvelity 1 daily for 1 week, then 1 Auvelity twice daily.
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
## 2021-10-25 ENCOUNTER — Ambulatory Visit (INDEPENDENT_AMBULATORY_CARE_PROVIDER_SITE_OTHER): Payer: Commercial Managed Care - PPO | Admitting: Psychology

## 2021-10-25 DIAGNOSIS — F4310 Post-traumatic stress disorder, unspecified: Secondary | ICD-10-CM

## 2021-10-25 DIAGNOSIS — F331 Major depressive disorder, recurrent, moderate: Secondary | ICD-10-CM

## 2021-10-25 NOTE — Progress Notes (Signed)
[facility] Counselor/Therapist Progress Note  Patient ID: Ronni Osterberg, MRN: [id number],    Date: 10/25/2021  Time Spent: 45 mins  Treatment Type: Individual Therapy  Reported Symptoms: Pt presents for session via webex video.  Pt grants consent for session, stating that she is in her home with no one else present.  I shared with pt that I am in my office at home with no one else here either.    Mental Status Exam: Appearance:  Casual     Behavior: Appropriate  Motor: Normal  Speech/Language:  Clear and Coherent  Affect: Appropriate  Mood: normal  Thought process: normal  Thought content:   WNL  Sensory/Perceptual disturbances:   WNL  Orientation: oriented to person, place, and time/date  Attention: Good  Concentration: Good  Memory: WNL  Fund of knowledge:  Good  Insight:   Good  Judgment:  Good  Impulse Control: Good   Risk Assessment: Danger to Self:  No Self-injurious Behavior: No Danger to Others: No Duty to Warn:no Physical Aggression / Violence:No  Access to Firearms a concern: No  Gang Involvement:No   Subjective: Pt shares that she has "been OK" since our last session; "I have just been muddling through."  Pt shares that she worked yesterday and she is tired due to work and also had a sick dog she had to get up with several times last night.  Pt also shares that she is scheduled to work F, Sat, and Sun this weekend.  Pt shares that she is continuing to struggle with keeping the apt clean and regularly taking a shower; shares that she has showered twice this past week.  Pt shares she did attend two mtgs this week and plans to go to another one tonight.  She prefers to attend in person mtgs so those are the ones she attends and she tends to know some people in them.  She also plans to talk with her sponsor this week, just to stay in touch.  Pt shares that her mom is doing OK; still planning to move to the [facility] in October; the facility has a great  reputation.  Her mom's health is declining (cognitive issues, joint and muscle issues, etc.).  Pt questions the possibility of a sleep issue; encouraged pt to make an appt with her PCP to talk about a possible home sleep study.  Also encouraged pt to make an appt with Dr Leda Quail or another GYN as soon as possible to address those needs as well.  Pt shares she has to take her car for an oil change today and will get out of the house for that need; encouraged pt to try to clean her dishes today as well.  Pt shares that financially "she has been doing OK but I do get some help from my mom from time to time."  Asked pt to focus on getting appts with her PCP to talk about a home sleep study and to get an appt with Dr. Hyacinth Meeker or another GYN.  Also asked pt to focus on cleaning the dishes currently in her sink and showering at least 3 times between now and our follow up session next week.   Interventions: Cognitive Behavioral Therapy  Diagnosis:PTSD (post-traumatic stress disorder)  Major depressive disorder, recurrent episode, moderate (HCC)  Plan: Treatment Plan Strengths/Abilities:  Intelligent, Intuitive, Willing to participate in therapy Treatment Preferences:  Outpatient Individual Therapy Statement of Needs:  Patient is to use CBT, mindfulness and coping skills to  help manage and/or decrease symptoms associated with their diagnosis. Symptoms:  Depressed/Irritable mood, worry, social withdrawal Problems Addressed:  Depressive thoughts, Sadness, Sleep issues, etc. Long Term Goals:  Pt to reduce overall level, frequency, and intensity of the feelings of depression/anxiety as evidenced by decreased irritability, negative self talk, and helpless feelings from 6 to 7 days/week to 0 to 1 days/week, per client report, for at least 3 consecutive months.  Progress: 10% Short Term Goals:  Pt to verbally express understanding of the relationship between feelings of depression/anxiety and their impact on  thinking patterns and behaviors.  Pt to verbalize an understanding of the role that distorted thinking plays in creating fears, excessive worry, and ruminations.  Progress: 10% Target Date:  10/05/2022 Frequency:  Bi-weekly Modality:  Cognitive Behavioral Therapy Interventions by Therapist:  Therapist will use CBT, Mindfulness exercises, Coping skills and Referrals, as needed by client. Client has verbally approved this treatment plan.  Karie Kirks, [facility]

## 2021-11-01 ENCOUNTER — Ambulatory Visit (INDEPENDENT_AMBULATORY_CARE_PROVIDER_SITE_OTHER): Payer: Commercial Managed Care - PPO | Admitting: Psychology

## 2021-11-01 DIAGNOSIS — F330 Major depressive disorder, recurrent, mild: Secondary | ICD-10-CM | POA: Diagnosis not present

## 2021-11-01 DIAGNOSIS — F4310 Post-traumatic stress disorder, unspecified: Secondary | ICD-10-CM

## 2021-11-01 NOTE — Progress Notes (Signed)
[facility] Counselor/Therapist Progress Note  Patient ID: Maria Morse, MRN: [id number],    Date: 11/01/2021  Time Spent: 45 mins  Treatment Type: Individual Therapy  Reported Symptoms: Pt presents for session via webex video.  Pt grants consent for session, stating that she is in her home with no one else present.  I shared with pt that I am in my office at home with no one else here either.    Mental Status Exam: Appearance:  Casual     Behavior: Appropriate  Motor: Normal  Speech/Language:  Clear and Coherent  Affect: Appropriate  Mood: normal  Thought process: normal  Thought content:   WNL  Sensory/Perceptual disturbances:   WNL  Orientation: oriented to person, place, and time/date  Attention: Good  Concentration: Good  Memory: WNL  Fund of knowledge:  Good  Insight:   Good  Judgment:  Good  Impulse Control: Good   Risk Assessment: Danger to Self:  No Self-injurious Behavior: No Danger to Others: No Duty to Warn:no Physical Aggression / Violence:No  Access to Firearms a concern: No  Gang Involvement:No   Subjective: Pt shares that she has been relaxing today after having worked yesterday.  She is scheduled for work next on Monday and 7/4, for which she gets holiday pay and she is looking forward to that.  Pt continues to go to mtgs and enjoys those.  She went out and got ice cream with some women after Monday night's mtg and she enjoyed that social interaction.  I shared with pt that she appears to be brighter in this session than she appeared last week.  She acknowledges that she does feel better today than she has in the past.  Pt shares "I feel a little perkier today.  I saw Dr. Jennelle Human on Monday for a medication check and he added a new medicine that she says might be helping her feel better already."  She also shares that she slept well last night and got an iced coffee today "which always boosts my mood."  Pt shares that she has dishes in the sink  that need to be washed; she shares that she thinks she can get them done by the end of the weekend.  Pt shares that she has moved to a town home closer to downtown [place]; the kitchen is much smaller than her previous place and it does not have a Public affairs consultant.  Pt has not reached out to Dr. Leda Quail for a new pt appt.  Pt has showered twice this past week, "but I did not make the three showers that we had talked about."  Pt is struggling to continue to engage in self care activities.  She believes this is a new situation for her with regard to her depression.  Asked pt to consider why she is struggling with self care activities at this time in her depression.  We will meet next week for a follow up session.  Interventions: Cognitive Behavioral Therapy  Diagnosis:PTSD (post-traumatic stress disorder)  Mild episode of recurrent major depressive disorder (HCC)  Plan: Treatment Plan Strengths/Abilities:  Intelligent, Intuitive, Willing to participate in therapy Treatment Preferences:  Outpatient Individual Therapy Statement of Needs:  Patient is to use CBT, mindfulness and coping skills to help manage and/or decrease symptoms associated with their diagnosis. Symptoms:  Depressed/Irritable mood, worry, social withdrawal Problems Addressed:  Depressive thoughts, Sadness, Sleep issues, etc. Long Term Goals:  Pt to reduce overall level, frequency, and intensity of the feelings of  depression/anxiety as evidenced by decreased irritability, negative self talk, and helpless feelings from 6 to 7 days/week to 0 to 1 days/week, per client report, for at least 3 consecutive months.  Progress: 10% Short Term Goals:  Pt to verbally express understanding of the relationship between feelings of depression/anxiety and their impact on thinking patterns and behaviors.  Pt to verbalize an understanding of the role that distorted thinking plays in creating fears, excessive worry, and ruminations.  Progress: 10% Target Date:   10/05/2022 Frequency:  Bi-weekly Modality:  Cognitive Behavioral Therapy Interventions by Therapist:  Therapist will use CBT, Mindfulness exercises, Coping skills and Referrals, as needed by client. Client has verbally approved this treatment plan.  Karie Kirks, [facility]

## 2021-11-05 ENCOUNTER — Other Ambulatory Visit: Payer: Self-pay | Admitting: Psychiatry

## 2021-11-05 DIAGNOSIS — F411 Generalized anxiety disorder: Secondary | ICD-10-CM

## 2021-11-05 DIAGNOSIS — F331 Major depressive disorder, recurrent, moderate: Secondary | ICD-10-CM

## 2021-11-05 DIAGNOSIS — F4310 Post-traumatic stress disorder, unspecified: Secondary | ICD-10-CM

## 2021-11-07 ENCOUNTER — Ambulatory Visit (INDEPENDENT_AMBULATORY_CARE_PROVIDER_SITE_OTHER): Payer: Commercial Managed Care - PPO | Admitting: Psychology

## 2021-11-07 DIAGNOSIS — F4310 Post-traumatic stress disorder, unspecified: Secondary | ICD-10-CM

## 2021-11-07 DIAGNOSIS — F330 Major depressive disorder, recurrent, mild: Secondary | ICD-10-CM

## 2021-11-07 NOTE — Progress Notes (Signed)
[facility] Counselor/Therapist Progress Note  Patient ID: Maria Morse, MRN: [id number],    Date: 11/07/2021  Time Spent: 45 mins  Treatment Type: Individual Therapy  Reported Symptoms: Pt presents for session via webex video.  Pt grants consent for session, stating that she is in her home with no one else present.  I shared with pt that I am in my office at home with no one else here either.    Mental Status Exam: Appearance:  Casual     Behavior: Appropriate  Motor: Normal  Speech/Language:  Clear and Coherent  Affect: Appropriate  Mood: normal  Thought process: normal  Thought content:   WNL  Sensory/Perceptual disturbances:   WNL  Orientation: oriented to person, place, and time/date  Attention: Good  Concentration: Good  Memory: WNL  Fund of knowledge:  Good  Insight:   Good  Judgment:  Good  Impulse Control: Good   Risk Assessment: Danger to Self:  No Self-injurious Behavior: No Danger to Others: No Duty to Warn:no Physical Aggression / Violence:No  Access to Firearms a concern: No  Gang Involvement:No   Subjective: Pt shares that she had to leave work early yesterday due to pain in her feet; she believes she will have to find a job that will not require her to be on her feet 12 hours per shift.  She indicates that there were conversations with supervisors at work yesterday; she has missed some days from work and has violated the attendance policy for [employer]; "I have missed quite a few days from work that might require termination."  Pt shares she had asked for a different assignment yesterday because the assignment was difficult for her the day before.  She tried to work the assignment and was not able to do so; she left about 11 am.  Pt feels frustrated that she does not feel very supported in that work setting.  Pt is scheduled to work again this Fri-Sun.  Pt has brushed up her resume and has been applying for office based jobs that can be a better fit  for her that would not require her to be on her feet 12 hours per shift.  Pt shares that it did not feel good for her to not be able to complete her shift yesterday.  Pt also talked to her mom and her sponsor yesterday as well.  Her sponsor is assisting her with job leads and next steps.  Suggested pt look at [employer] [facility] website for possible options.  Pt shares she had a quiet weekend; went to the movies with a friend, played with her dogs, and attended meetings.  Pt shares that she did her dishes that needed to be washed and cleaned more of the kitchen.  She has not dishes currently that need to be washed.  Pt also shares that she is keeping her laundry up to date.  She is still struggling with showering regularly.  Pt continues to take her medication daily but her energy level is not quite enough to encourage her to engage in showering.  Encouraged pt to continue her focus on keeping her kitchen clean, engaging in positive affirmations for herself daily, and to try to shower at least every other day between now and our follow up session next week.  Interventions: Cognitive Behavioral Therapy  Diagnosis:PTSD (post-traumatic stress disorder)  Mild episode of recurrent major depressive disorder (HCC)  Plan: Treatment Plan Strengths/Abilities:  Intelligent, Intuitive, Willing to participate in therapy Treatment Preferences:  Outpatient Individual  Therapy Statement of Needs:  Patient is to use CBT, mindfulness and coping skills to help manage and/or decrease symptoms associated with their diagnosis. Symptoms:  Depressed/Irritable mood, worry, social withdrawal Problems Addressed:  Depressive thoughts, Sadness, Sleep issues, etc. Long Term Goals:  Pt to reduce overall level, frequency, and intensity of the feelings of depression/anxiety as evidenced by decreased irritability, negative self talk, and helpless feelings from 6 to 7 days/week to 0 to 1 days/week, per client report, for at least 3 consecutive  months.  Progress: 10% Short Term Goals:  Pt to verbally express understanding of the relationship between feelings of depression/anxiety and their impact on thinking patterns and behaviors.  Pt to verbalize an understanding of the role that distorted thinking plays in creating fears, excessive worry, and ruminations.  Progress: 10% Target Date:  10/05/2022 Frequency:  Bi-weekly Modality:  Cognitive Behavioral Therapy Interventions by Therapist:  Therapist will use CBT, Mindfulness exercises, Coping skills and Referrals, as needed by client. Client has verbally approved this treatment plan.  Karie Kirks, [facility]

## 2021-11-08 ENCOUNTER — Telehealth: Payer: Self-pay | Admitting: Psychiatry

## 2021-11-08 DIAGNOSIS — F331 Major depressive disorder, recurrent, moderate: Secondary | ICD-10-CM

## 2021-11-08 MED ORDER — AUVELITY 45-105 MG PO TBCR: 1.0000 | EXTENDED_RELEASE_TABLET | Freq: Two times a day (BID) | ORAL | 0 refills | Status: DC

## 2021-11-08 NOTE — Telephone Encounter (Signed)
[facility] lvm stating no side effects from Auvel[place] samples given. Patient is requesting a Rx sent to pharmacy.  A follow up is scheduled for 8/31. Contact # [phone].

## 2021-11-08 NOTE — Telephone Encounter (Signed)
Thank you for the info.  I agree.

## 2021-11-16 ENCOUNTER — Ambulatory Visit (INDEPENDENT_AMBULATORY_CARE_PROVIDER_SITE_OTHER): Payer: Commercial Managed Care - PPO | Admitting: Psychology

## 2021-11-16 DIAGNOSIS — F331 Major depressive disorder, recurrent, moderate: Secondary | ICD-10-CM | POA: Diagnosis not present

## 2021-11-16 DIAGNOSIS — F4310 Post-traumatic stress disorder, unspecified: Secondary | ICD-10-CM | POA: Diagnosis not present

## 2021-11-16 NOTE — Progress Notes (Signed)
[facility] Counselor/Therapist Progress Note  Patient ID: Maria Morse, MRN: [id number],    Date: 11/16/2021  Time Spent: 45 mins  Treatment Type: Individual Therapy  Reported Symptoms: Pt presents for session via webex video.  Pt grants consent for session, stating that she is in her home with no one else present.  I shared with pt that I am in my office at home with no one else here either.    Mental Status Exam: Appearance:  Casual     Behavior: Appropriate  Motor: Normal  Speech/Language:  Clear and Coherent  Affect: Appropriate  Mood: normal  Thought process: normal  Thought content:   WNL  Sensory/Perceptual disturbances:   WNL  Orientation: oriented to person, place, and time/date  Attention: Good  Concentration: Good  Memory: WNL  Fund of knowledge:  Good  Insight:   Good  Judgment:  Good  Impulse Control: Good   Risk Assessment: Danger to Self:  No Self-injurious Behavior: No Danger to Others: No Duty to Warn:no Physical Aggression / Violence:No  Access to Firearms a concern: No  Gang Involvement:No   Subjective: Pt shares that she "is hanging in there.  I am on day two of my period and I am feeling better but still have a ways to go."  Pt shares that she was able to go back to work on Fri, Sat, and Sun and then Wed, and Th this week.  She has been able to work the whole shift.  She found a better pair of shoes that have better support.  Pt shares she is trying to push through and accomplish what she needs to.  Encouraged pt to reflect on completed shifts and the work she accomplished for her pts during those shifts; tried to impress upon her the need to appreciate her accomplishments for the pts at work.  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  Pt continues to struggle with self care because of her monthly cycle but wants to make progress with this issue.  She will continue to try to pursue self care options in the next week; she has this weekend off from work.  Encouraged pt to continue her focus on keeping her kitchen clean, engaging in positive affirmations for herself daily, and to try to shower at least every other day between now and our follow up session next week.  Interventions: Cognitive Behavioral Therapy  Diagnosis:PTSD (post-traumatic stress disorder)  Major depressive disorder, recurrent episode, moderate (HCC)  Plan: Treatment Plan Strengths/Abilities:  Intelligent, Intuitive, Willing to participate in therapy Treatment Preferences:  Outpatient Individual Therapy Statement of Needs:  Patient is to use CBT, mindfulness and coping skills to help manage and/or decrease symptoms associated with their diagnosis. Symptoms:  Depressed/Irritable mood, worry, social withdrawal Problems Addressed:  Depressive thoughts, Sadness, Sleep issues, etc. Long Term Goals:  Pt to reduce overall level, frequency, and intensity of the feelings of depression/anxiety as evidenced by decreased irritability, negative self talk, and helpless feelings from 6 to 7 days/week to 0 to 1 days/week, per client report, for at least 3 consecutive months.  Progress: 10% Short Term Goals:  Pt to verbally express understanding of the relationship  between feelings of depression/anxiety and their impact on thinking patterns and behaviors.  Pt to verbalize an understanding of the role that distorted thinking plays in creating fears, excessive worry, and ruminations.  Progress: 10% Target Date:  10/05/2022 Frequency:  Bi-weekly Modality:  Cognitive Behavioral Therapy Interventions by Therapist:  Therapist will use CBT, Mindfulness exercises, Coping skills and  Referrals, as needed by client. Client has verbally approved this treatment plan.  Karie Kirks, [facility]

## 2021-11-22 ENCOUNTER — Ambulatory Visit (INDEPENDENT_AMBULATORY_CARE_PROVIDER_SITE_OTHER): Payer: Commercial Managed Care - PPO | Admitting: Psychology

## 2021-11-22 DIAGNOSIS — F4310 Post-traumatic stress disorder, unspecified: Secondary | ICD-10-CM

## 2021-11-22 DIAGNOSIS — F330 Major depressive disorder, recurrent, mild: Secondary | ICD-10-CM | POA: Diagnosis not present

## 2021-11-22 NOTE — Progress Notes (Signed)
[facility] Counselor/Therapist Progress Note  Patient ID: Maria Morse, MRN: [id number],    Date: 11/22/2021  Time Spent: 45 mins  Treatment Type: Individual Therapy  Reported Symptoms: Pt presents for session via webex video.  Pt grants consent for session, stating that she is in her home with no one else present.  I shared with pt that I am in my office at home with no one else here either.    Mental Status Exam: Appearance:  Casual     Behavior: Appropriate  Motor: Normal  Speech/Language:  Clear and Coherent  Affect: Appropriate  Mood: normal  Thought process: normal  Thought content:   WNL  Sensory/Perceptual disturbances:   WNL  Orientation: oriented to person, place, and time/date  Attention: Good  Concentration: Good  Memory: WNL  Fund of knowledge:  Good  Insight:   Good  Judgment:  Good  Impulse Control: Good   Risk Assessment: Danger to Self:  No Self-injurious Behavior: No Danger to Others: No Duty to Warn:no Physical Aggression / Violence:No  Access to Firearms a concern: No  Gang Involvement:No   Subjective: Pt shares that she "has been alright, not too bad."  Pt shares she has been working but has had some days off as well.  She is scheduled to work this weekend again.  Pt shares she has been attending mtgs (Tues and Wed and going to one tonight).  Pt continues to sleep well; "probably been sleeping a little too much."  She shares she has been sleeping until 2 or 3pm and falls to sleep that night just fine as well.  She shares she is "managing the dishes ok right now."  Pt shares that her appetite is fine; "I eat one to two meals per day."  Pt shares she continues to only shower once or twice per week.  Talked with pt about what might have motivated her in the past to do the things that are good for her.  She shares that she feels like it might be an issue of self worth and her's dropping since she left her last job.  She shares she still thinks  about that situation and "I think I have some feelings of regret about that situation."  Pt shares she feels like she could have been a little more responsible for her part in the situation.  Pt shares she has not had any feedback on the positions she has applied for except for taking an online assessment for the position with [employer] and she did not complete the full assessment.  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  She is working F, Sat, and Sun this week.  Pt shares she has felt OK physically this week; she feels tired most of the time.  Pt reiterates that she is unhappy in her current position.  Encouraged pt to reflect on completed shifts and the work she accomplished for her pts during those shifts; tried to impress upon her the need to appreciate her accomplishments for the pts at work.  Talked with pt about the benefits of reaching out to pts in as caring away as she can as a means for helping the pts and enabling her to feel better about the work she is performing.   Encouraged pt to continue her focus on keeping her kitchen clean, engaging in positive affirmations for herself daily, and to try to shower at least every other day between now and our follow up  session next week.  Interventions: Cognitive Behavioral Therapy  Diagnosis:PTSD (post-traumatic stress disorder)  Mild episode of recurrent major depressive disorder (HCC)  Plan: Treatment Plan Strengths/Abilities:  Intelligent, Intuitive, Willing to participate in therapy Treatment Preferences:  Outpatient Individual Therapy Statement of Needs:  Patient is to use CBT, mindfulness and coping skills to help manage and/or decrease symptoms associated with their diagnosis. Symptoms:  Depressed/Irritable mood, worry, social withdrawal Problems Addressed:  Depressive thoughts, Sadness, Sleep issues, etc. Long Term Goals:  Pt to reduce overall level, frequency, and intensity of the feelings of  depression/anxiety as evidenced by decreased irritability, negative self talk, and helpless feelings from 6 to 7 days/week to 0 to 1 days/week, per client report, for at least 3 consecutive months.  Progress: 10% Short Term Goals:  Pt to verbally express understanding of the relationship between feelings of depression/anxiety and their impact on thinking patterns and behaviors.  Pt to verbalize an understanding of the role that distorted thinking plays in creating fears, excessive worry, and ruminations.  Progress: 10% Target Date:  10/05/2022 Frequency:  Bi-weekly Modality:  Cognitive Behavioral Therapy Interventions by Therapist:  Therapist will use CBT, Mindfulness exercises, Coping skills and Referrals, as needed by client. Client has verbally approved this treatment plan.  Karie Kirks, [facility]

## 2021-11-28 ENCOUNTER — Ambulatory Visit (INDEPENDENT_AMBULATORY_CARE_PROVIDER_SITE_OTHER): Payer: Commercial Managed Care - PPO | Admitting: Psychology

## 2021-11-28 DIAGNOSIS — F330 Major depressive disorder, recurrent, mild: Secondary | ICD-10-CM | POA: Diagnosis not present

## 2021-11-28 DIAGNOSIS — F4310 Post-traumatic stress disorder, unspecified: Secondary | ICD-10-CM | POA: Diagnosis not present

## 2021-11-28 NOTE — Progress Notes (Signed)
[facility] Counselor/Therapist Progress Note  Patient ID: Maria Morse, MRN: [id number],    Date: 11/28/2021  Time Spent: 45 mins  Treatment Type: Individual Therapy  Reported Symptoms: Pt presents for session via webex video.  Pt grants consent for session, stating that she is in her home with no one else present.  I shared with pt that I am in my office at home with no one else here either.    Mental Status Exam: Appearance:  Casual     Behavior: Appropriate  Motor: Normal  Speech/Language:  Clear and Coherent  Affect: Appropriate  Mood: normal  Thought process: normal  Thought content:   WNL  Sensory/Perceptual disturbances:   WNL  Orientation: oriented to person, place, and time/date  Attention: Good  Concentration: Good  Memory: WNL  Fund of knowledge:  Good  Insight:   Good  Judgment:  Good  Impulse Control: Good   Risk Assessment: Danger to Self:  No Self-injurious Behavior: No Danger to Others: No Duty to Warn:no Physical Aggression / Violence:No  Access to Firearms a concern: No  Gang Involvement:No   Subjective: Pt shares that she "is visiting her mom for a week" and is happy about being able to have a change of scenery and she loves her mom.  Her mom is excited to see pt for several days.  Pt made it through her 3 day weekend last weekend and is glad to have this week off; she goes back to work weekend after this upcoming one.  Pt is planning to go to the beach while she is there; she is having lunch tomorrow with her best friend/college room mate and she is excited about that.  She also attends mtgs when she is there; her mom is in recovery as well.  The weather is hot where she is and she will be careful.  Talked more about being able to appreciate what she does for her pts in her daily work.  Pt shares that she got her hair cut yesterday when she got to her mom's; she acknowledges the self care aspect to the activity.  They also went out to a good  dinner last night as well.  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  Congratulated pt on signing up for these and for making the commitment.  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  The salary is closer to what she was used to make at the [employer].  Encouraged pt to continue with her self care activities and we will meet in 2 wks for a follow up session.  Interventions: Cognitive Behavioral Therapy  Diagnosis:PTSD (post-traumatic stress disorder)  Mild episode of recurrent major depressive disorder (HCC)  Plan: Treatment Plan Strengths/Abilities:  Intelligent, Intuitive, Willing to participate in therapy Treatment Preferences:  Outpatient Individual Therapy Statement of Needs:  Patient is to use CBT, mindfulness and coping skills to help manage and/or decrease symptoms associated with their diagnosis. Symptoms:  Depressed/Irritable mood, worry, social withdrawal Problems Addressed:  Depressive thoughts, Sadness, Sleep issues, etc. Long Term Goals:  Pt to reduce overall level, frequency, and intensity of the feelings of depression/anxiety as evidenced by decreased irritability, negative self talk, and helpless feelings from 6 to 7 days/week to 0 to 1 days/week, per client report, for at  least 3 consecutive months.  Progress: 10% Short Term Goals:  Pt to verbally express understanding of the relationship between feelings of depression/anxiety and their impact on thinking patterns and behaviors.  Pt to verbalize an understanding of the role that distorted thinking plays in creating fears, excessive worry, and ruminations.  Progress: 10% Target Date:  10/05/2022 Frequency:  Bi-weekly Modality:  Cognitive Behavioral Therapy Interventions by Therapist:  Therapist will use CBT, Mindfulness  exercises, Coping skills and Referrals, as needed by client. Client has verbally approved this treatment plan.  Karie Kirks, [facility]

## 2021-12-12 ENCOUNTER — Ambulatory Visit (INDEPENDENT_AMBULATORY_CARE_PROVIDER_SITE_OTHER): Payer: Commercial Managed Care - PPO | Admitting: Psychology

## 2021-12-12 DIAGNOSIS — F4310 Post-traumatic stress disorder, unspecified: Secondary | ICD-10-CM | POA: Diagnosis not present

## 2021-12-12 DIAGNOSIS — F331 Major depressive disorder, recurrent, moderate: Secondary | ICD-10-CM

## 2021-12-12 NOTE — Progress Notes (Signed)
[facility] Counselor/Therapist Progress Note  Patient ID: Maria Morse, MRN: [id number],    Date: 12/12/2021  Time Spent: 45 mins  Treatment Type: Individual Therapy  Reported Symptoms: Pt presents for session via webex video.  Pt grants consent for session, stating that she is in her home with no one else present.  I shared with pt that I am in my office with no one else here either.    Mental Status Exam: Appearance:  Casual     Behavior: Appropriate  Motor: Normal  Speech/Language:  Clear and Coherent  Affect: Appropriate  Mood: normal  Thought process: normal  Thought content:   WNL  Sensory/Perceptual disturbances:   WNL  Orientation: oriented to person, place, and time/date  Attention: Good  Concentration: Good  Memory: WNL  Fund of knowledge:  Good  Insight:   Good  Judgment:  Good  Impulse Control: Good   Risk Assessment: Danger to Self:  No Self-injurious Behavior: No Danger to Others: No Duty to Warn:no Physical Aggression / Violence:No  Access to Firearms a concern: No  Gang Involvement:No   Subjective: Pt shares that she enjoyed visiting her mom.  She noticed more examples of aging in her mom.  She has concerns for her mom moving forward; her mom is moving to independent living facility in Oct.  Her mom is planning to keep her home and live in it [place].  Pt is hoping that her mom will appreciate the move to a facility that has other people in similar situations.  Pt made the drive to and from [place] without incident; the dogs had a nice time with her mom and her new dog.  Pt shares she worked this past weekend and it went pretty well.  She felt she had more energy after having been gone for a week.  She had lunch with her best friend/college room mate and enjoyed that visit; pt was bouyed by her friend commiserating with similar issues as well.  Pt was not happy about her friend's struggles but was happy to know that her  own struggles are not isolated to just her.  Pt felt validated by her visit with her mom and her friend.  Pt also attended on the beach at 7am several days while she was with her mom.  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  Pt is anticipating her next monthly massage and is looking forward to continuing that work.  Pt shares that her dishes are caught up; "the sink is empty."  She has done laundry and has gone to the grocery store to get her food to enable her to eat more meals at home.  Pt shares that she has not yet gotten back into applying for other positions.  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  Encouraged pt to continue looking for new opportunities.  Pt shares that her period started to day and she is effected by that.  Pt may be interested in looking for a new church to meet her spiritual needs and emotional needs.  Encouraged pt continue with her self care activities as intentionally as possible and we will meet in 2 wks for a follow up session.  Interventions: Cognitive Behavioral Therapy  Diagnosis:PTSD (post-traumatic stress disorder)  Moderate episode of recurrent major depressive disorder (HCC)  Plan: Treatment Plan Strengths/Abilities:  Intelligent,  Intuitive, Willing to participate in therapy Treatment Preferences:  Outpatient Individual Therapy Statement of Needs:  Patient is to use CBT, mindfulness and coping skills to help manage and/or decrease symptoms associated with their diagnosis. Symptoms:  Depressed/Irritable mood, worry, social withdrawal Problems Addressed:  Depressive thoughts, Sadness, Sleep issues, etc. Long Term Goals:  Pt to reduce overall level, frequency, and intensity of the feelings of depression/anxiety as evidenced by decreased irritability, negative self talk, and helpless feelings from 6 to 7 days/week to 0 to 1 days/week, per client report, for at least 3 consecutive  months.  Progress: 10% Short Term Goals:  Pt to verbally express understanding of the relationship between feelings of depression/anxiety and their impact on thinking patterns and behaviors.  Pt to verbalize an understanding of the role that distorted thinking plays in creating fears, excessive worry, and ruminations.  Progress: 10% Target Date:  10/05/2022 Frequency:  Bi-weekly Modality:  Cognitive Behavioral Therapy Interventions by Therapist:  Therapist will use CBT, Mindfulness exercises, Coping skills and Referrals, as needed by client. Client has verbally approved this treatment plan.  Karie Kirks, [facility]

## 2021-12-28 ENCOUNTER — Ambulatory Visit (INDEPENDENT_AMBULATORY_CARE_PROVIDER_SITE_OTHER): Payer: Commercial Managed Care - PPO | Admitting: Psychology

## 2021-12-28 DIAGNOSIS — F4310 Post-traumatic stress disorder, unspecified: Secondary | ICD-10-CM

## 2021-12-28 DIAGNOSIS — F330 Major depressive disorder, recurrent, mild: Secondary | ICD-10-CM | POA: Diagnosis not present

## 2021-12-28 NOTE — Progress Notes (Signed)
[facility] Counselor/Therapist Progress Note  Patient ID: Maria Morse, MRN: [id number],    Date: 12/28/2021  Time Spent: 45 mins  Treatment Type: Individual Therapy  Reported Symptoms: Pt presents for session via webex video.  Pt grants consent for session, stating that she is in her home with no one else present.  I shared with pt that I am in my office with no one else here either.    Mental Status Exam: Appearance:  Casual     Behavior: Appropriate  Motor: Normal  Speech/Language:  Clear and Coherent  Affect: Appropriate  Mood: normal  Thought process: normal  Thought content:   WNL  Sensory/Perceptual disturbances:   WNL  Orientation: oriented to person, place, and time/date  Attention: Good  Concentration: Good  Memory: WNL  Fund of knowledge:  Good  Insight:   Good  Judgment:  Good  Impulse Control: Good   Risk Assessment: Danger to Self:  No Self-injurious Behavior: No Danger to Others: No Duty to Warn:no Physical Aggression / Violence:No  Access to Firearms a concern: No  Gang Involvement:No   Subjective: Pt shares that she has been doing well since our last session; she worked [employer] and is scheduled for this weekend as well.  She is still struggling with her foot pain and she is still breaking in her new shoes.  Pt shares she has been trying to be more mindful about self care activities; she is still struggling to shower frequently.  Talked with pt about the possible benefits of [place]rav[place] and or TMS for her chronic depression; she is scheduled to see Dr. Jennelle Human next week and will talk with him about these options.  Pt shares that her sleep has been pretty good since our session; she sometimes feels she is sleeping too much but she is also working 12 hr shifts at work that are tiring.  Pt shares that she does drink coffee at some of the mtgs she attends and that interferes with her trying to go to sleep after those mtgs.  Talked about the option of  switching to decaff coffee at mtgs.  She also mentions that she has been having more "intrusive thoughts lately."  Pt shares she has been having them for the past couple of weeks and they are just quick thoughts and she does not linger on them.  The thoughts tend to center around her previous trauma and or thoughts about her own death, but not about her causing her own death.  Encouraged pt to be as intentional as possible about engaging in her self care activities to help with this issue.  Encouraged pt continue with her self care activities as intentionally as possible and we will meet in 2 wks for a follow up session.  Interventions: Cognitive Behavioral Therapy  Diagnosis:PTSD (post-traumatic stress disorder)  Mild episode of recurrent major depressive disorder (HCC)  Plan: Treatment Plan Strengths/Abilities:  Intelligent, Intuitive, Willing to participate in therapy Treatment Preferences:  Outpatient Individual Therapy Statement of Needs:  Patient is to use CBT, mindfulness and coping skills to help manage and/or decrease symptoms associated with their diagnosis. Symptoms:  Depressed/Irritable mood, worry, social withdrawal Problems Addressed:  Depressive thoughts, Sadness, Sleep issues, etc. Long Term Goals:  Pt to reduce overall level, frequency, and intensity of the feelings of depression/anxiety as evidenced by decreased irritability, negative self talk, and helpless feelings from 6 to 7 days/week to 0 to 1 days/week, per client report, for at least 3 consecutive months.  Progress: 10% Short Term Goals:  Pt to verbally express understanding of the relationship between feelings of depression/anxiety and their impact on thinking patterns and behaviors.  Pt to verbalize an understanding of the role that distorted thinking plays in creating fears, excessive worry, and ruminations.  Progress: 10% Target Date:  10/05/2022 Frequency:  Bi-weekly Modality:  Cognitive Behavioral  Therapy Interventions by Therapist:  Therapist will use CBT, Mindfulness exercises, Coping skills and Referrals, as needed by client. Client has verbally approved this treatment plan.  Karie Kirks, [facility]

## 2022-01-03 ENCOUNTER — Encounter: Payer: Self-pay | Admitting: Psychiatry

## 2022-01-03 ENCOUNTER — Ambulatory Visit (INDEPENDENT_AMBULATORY_CARE_PROVIDER_SITE_OTHER): Payer: Commercial Managed Care - PPO | Admitting: Psychiatry

## 2022-01-03 ENCOUNTER — Other Ambulatory Visit: Payer: Self-pay | Admitting: Psychiatry

## 2022-01-03 DIAGNOSIS — F411 Generalized anxiety disorder: Secondary | ICD-10-CM

## 2022-01-03 DIAGNOSIS — F332 Major depressive disorder, recurrent severe without psychotic features: Secondary | ICD-10-CM

## 2022-01-03 DIAGNOSIS — F4310 Post-traumatic stress disorder, unspecified: Secondary | ICD-10-CM

## 2022-01-03 DIAGNOSIS — F331 Major depressive disorder, recurrent, moderate: Secondary | ICD-10-CM

## 2022-01-03 MED ORDER — LITHIUM CARBONATE ER 300 MG PO TBCR: 300.0000 mg | EXTENDED_RELEASE_TABLET | Freq: Two times a day (BID) | ORAL | 0 refills | Status: DC
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
## 2022-01-07 ENCOUNTER — Other Ambulatory Visit: Payer: Self-pay | Admitting: Psychiatry

## 2022-01-07 DIAGNOSIS — F331 Major depressive disorder, recurrent, moderate: Secondary | ICD-10-CM

## 2022-01-11 ENCOUNTER — Ambulatory Visit (INDEPENDENT_AMBULATORY_CARE_PROVIDER_SITE_OTHER): Payer: Commercial Managed Care - PPO | Admitting: Psychology

## 2022-01-11 DIAGNOSIS — F330 Major depressive disorder, recurrent, mild: Secondary | ICD-10-CM

## 2022-01-11 DIAGNOSIS — F4310 Post-traumatic stress disorder, unspecified: Secondary | ICD-10-CM

## 2022-01-11 NOTE — Progress Notes (Signed)
[facility] Counselor/Therapist Progress Note  Patient ID: Maria Morse, MRN: [id number],    Date: 01/11/2022  Time Spent: 45 mins  Treatment Type: Individual Therapy  Reported Symptoms: Pt presents for session via webex video.  Pt grants consent for session, stating that she is in her home with no one else present.  I shared with pt that I am in my office with no one else here either.    Mental Status Exam: Appearance:  Casual     Behavior: Appropriate  Motor: Normal  Speech/Language:  Clear and Coherent  Affect: Appropriate  Mood: normal  Thought process: normal  Thought content:   WNL  Sensory/Perceptual disturbances:   WNL  Orientation: oriented to person, place, and time/date  Attention: Good  Concentration: Good  Memory: WNL  Fund of knowledge:  Good  Insight:   Good  Judgment:  Good  Impulse Control: Good   Risk Assessment: Danger to Self:  No Self-injurious Behavior: No Danger to Others: No Duty to Warn:no Physical Aggression / Violence:No  Access to Firearms a concern: No  Gang Involvement:No   Subjective: Pt shares that she has been doing well since our last session; she has seen Dr. Clovis Pu last week and they 'tweaked' her medications; he added Lithium to her medication regimen.  Talked with pts about this addition and any thoughts/feelings about adding this medication.  Pt shares that her life continues to be pretty much the same as when we met a couple of weeks ago.  She shares she has tried to use some new ways to encourage herself to shower, like listening to music when she showers.  She continues to go to 12 step mtgs and normally goes out with friends after her Sat evening mtg and she "looks forward to that."  Pt shares that work "is going a little bit better this week.  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  She even shared that compliment with her bosses.  I have been more intentional about finding  gratitude in my daily work and in the service I provide."  Pt shares that the [facility] contract with [facility] is ending and several prisoners have been sent back to [employer].  The prisoner pt worked with who was troubling for her has been sent back to [place].  Pt is currently thinking about looking for other positions to transition to if her hours get cut at [employer]; we talked about different options for pt in this regard.  Talked with pt about how her mood seems to be a little a brighter today than in past sessions.  She shares she does feel this and it is interesting that her period just started two days ago.  Encouraged pt continue with her self care activities as intentionally as possible and we will meet in 2 wks for a follow up session.  Interventions: Cognitive Behavioral Therapy  Diagnosis:PTSD (post-traumatic stress disorder)  Mild episode of recurrent major depressive disorder ([place])  Plan: Treatment Plan Strengths/Abilities:  Intelligent, Intuitive, Willing to participate in therapy Treatment Preferences:  Outpatient Individual Therapy Statement of Needs:  Patient is to use CBT, mindfulness and coping skills to help manage and/or decrease symptoms associated with their diagnosis. Symptoms:  Depressed/Irritable mood, worry, social withdrawal Problems Addressed:  Depressive thoughts, Sadness, Sleep issues, etc. Long Term Goals:  Pt to reduce overall level, frequency, and intensity of the feelings of depression/anxiety as evidenced by decreased irritability,  negative self talk, and helpless feelings from 6 to 7 days/week to 0 to 1 days/week, per client report, for at least 3 consecutive months.  Progress: 10% Short Term Goals:  Pt to verbally express understanding of the relationship between feelings of depression/anxiety and their impact on thinking patterns and behaviors.  Pt to verbalize an understanding of the role that distorted thinking plays in creating fears, excessive worry, and  ruminations.  Progress: 10% Target Date:  10/05/2022 Frequency:  Bi-weekly Modality:  Cognitive Behavioral Therapy Interventions by Therapist:  Therapist will use CBT, Mindfulness exercises, Coping skills and Referrals, as needed by client. Client has verbally approved this treatment plan.  Ivan Anchors, [facility]

## 2022-01-14 ENCOUNTER — Other Ambulatory Visit: Payer: Self-pay | Admitting: Psychiatry

## 2022-01-14 DIAGNOSIS — F332 Major depressive disorder, recurrent severe without psychotic features: Secondary | ICD-10-CM

## 2022-01-24 ENCOUNTER — Ambulatory Visit (INDEPENDENT_AMBULATORY_CARE_PROVIDER_SITE_OTHER): Payer: Commercial Managed Care - PPO | Admitting: Psychology

## 2022-01-24 DIAGNOSIS — F330 Major depressive disorder, recurrent, mild: Secondary | ICD-10-CM | POA: Diagnosis not present

## 2022-01-24 DIAGNOSIS — F4310 Post-traumatic stress disorder, unspecified: Secondary | ICD-10-CM

## 2022-01-24 NOTE — Progress Notes (Signed)
[facility] Counselor/Therapist Progress Note  Patient ID: Maria Morse, MRN: [id number],    Date: 01/24/2022  Time Spent: 45 mins  Treatment Type: Individual Therapy  Reported Symptoms: Pt presents for session via webex video.  Pt grants consent for session, stating that she is in her home with no one else present.  I shared with pt that I am in my office with no one else here either.    Mental Status Exam: Appearance:  Casual     Behavior: Appropriate  Motor: Normal  Speech/Language:  Clear and Coherent  Affect: Appropriate  Mood: normal  Thought process: normal  Thought content:   WNL  Sensory/Perceptual disturbances:   WNL  Orientation: oriented to person, place, and time/date  Attention: Good  Concentration: Good  Memory: WNL  Fund of knowledge:  Good  Insight:   Good  Judgment:  Good  Impulse Control: Good   Risk Assessment: Danger to Self:  No Self-injurious Behavior: No Danger to Others: No Duty to Warn:no Physical Aggression / Violence:No  Access to Firearms a concern: No  Gang Involvement:No   Subjective: Pt shares that she has been doing well since our last session; "I have been working a lot.  I finally have several days off--I don't have to be back at work until Tuesday.  I do have a date tomorrow night as well."  Pt shares she has been on a dating app recently and she had a "meet and greet" with a gentleman and that went well.  They are planning to go out to dinner tomorrow evening.  Pt shares she continues to take her medications from Dr. Jennelle Human with no issues to report.  Pt shares that her dogs are doing well; one of her dogs is going to the groomers on Monday.  Pt shares that her mom is doing well; she is still planning to move into the independent living place in mid-October and that is a big adjustment.  Her mom seems to feel more positive about it at this point.  Pt hopes to be able to go to the facility as her mom is moving in to help her  get settled.  She continues to go to 12 step mtgs; she mostly goes on her days off and sometimes that is hard for her.  Pt shares there have been several pts at work who have shared with pt that they appreciate her as their caregiver and that is meaningful for pt.  Encouraged pt to hold onto those positive thoughts and feelings and to let them wash over her.  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  Pt shares she "has been sleeping OK lately."  She shares she is feeling a bit more effected by caffeine later in the evenings and will watch that moving forward.  Encouraged pt continue with her self care activities as intentionally as possible and we will meet in 4 wks for a follow up session, due to my vacation.  Interventions: Cognitive Behavioral Therapy  Diagnosis:PTSD (post-traumatic stress disorder)  Mild episode of recurrent major depressive disorder (HCC)  Plan: Treatment Plan Strengths/Abilities:  Intelligent, Intuitive, Willing to participate in therapy Treatment Preferences:  Outpatient Individual Therapy Statement of Needs:  Patient is to use CBT, mindfulness  and coping skills to help manage and/or decrease symptoms associated with their diagnosis. Symptoms:  Depressed/Irritable mood, worry, social withdrawal Problems Addressed:  Depressive thoughts, Sadness, Sleep issues, etc. Long Term Goals:  Pt to reduce overall level, frequency, and intensity of the feelings of depression/anxiety as evidenced by decreased irritability, negative self talk, and helpless feelings from 6 to 7 days/week to 0 to 1 days/week, per client report, for at least 3 consecutive months.  Progress: 30% Short Term Goals:  Pt to verbally express understanding of the relationship  between feelings of depression/anxiety and their impact on thinking patterns and behaviors.  Pt to verbalize an understanding of the role that distorted thinking plays in creating fears, excessive worry, and ruminations.  Progress: 30% Target Date:  10/05/2022 Frequency:  Bi-weekly Modality:  Cognitive Behavioral Therapy Interventions by Therapist:  Therapist will use CBT, Mindfulness exercises, Coping skills and Referrals, as needed by client. Client has verbally approved this treatment plan.  Ivan Anchors, [facility]

## 2022-02-03 ENCOUNTER — Other Ambulatory Visit: Payer: Self-pay | Admitting: Psychiatry

## 2022-02-03 DIAGNOSIS — F4310 Post-traumatic stress disorder, unspecified: Secondary | ICD-10-CM

## 2022-02-03 DIAGNOSIS — F410 Panic disorder [episodic paroxysmal anxiety] without agoraphobia: Secondary | ICD-10-CM

## 2022-02-04 NOTE — Telephone Encounter (Signed)
Filled 9/3 appt 11/13

## 2022-02-06 ENCOUNTER — Other Ambulatory Visit: Payer: Self-pay | Admitting: Psychiatry

## 2022-02-06 ENCOUNTER — Telehealth: Payer: Self-pay | Admitting: Psychiatry

## 2022-02-06 DIAGNOSIS — F331 Major depressive disorder, recurrent, moderate: Secondary | ICD-10-CM

## 2022-02-06 MED ORDER — AUVELITY 45-105 MG PO TBCR: 1.0000 | EXTENDED_RELEASE_TABLET | Freq: Two times a day (BID) | ORAL | 0 refills | Status: DC

## 2022-02-06 NOTE — Telephone Encounter (Signed)
Please review

## 2022-02-06 NOTE — Telephone Encounter (Signed)
This message is vague. Pls clarify with pt that is what was needed.  I assume what it is asking is to send a prescription for the [facility] into the new [facility].  That is what I did.

## 2022-02-06 NOTE — Telephone Encounter (Signed)
Pt called reporting CC advised after samples of Auvelity are gone call office. He will be able to send the equivalent of Auvelity to [facility]. Apt 11/23 Pt contact # [phone]

## 2022-02-07 NOTE — Telephone Encounter (Signed)
Pt LVM @ 9:36a.  Pt says she and Dr Clovis Pu discussed that if the [facility] wasn't covered, he could write her a script for 2 other meds that were equivalent to the Avuelity.  Her message says that the ins won't cover the Avuelity so she would like the other 2 meds sent to the pharmacy.  Next appt 11/13

## 2022-02-07 NOTE — Telephone Encounter (Signed)
Please see message the note does not mention this

## 2022-02-07 NOTE — Telephone Encounter (Signed)
LVM to rtc 

## 2022-02-16 ENCOUNTER — Other Ambulatory Visit: Payer: Self-pay | Admitting: Psychiatry

## 2022-02-16 DIAGNOSIS — F332 Major depressive disorder, recurrent severe without psychotic features: Secondary | ICD-10-CM

## 2022-02-19 ENCOUNTER — Other Ambulatory Visit: Payer: Self-pay | Admitting: Psychiatry

## 2022-02-19 DIAGNOSIS — F331 Major depressive disorder, recurrent, moderate: Secondary | ICD-10-CM

## 2022-02-19 DIAGNOSIS — F411 Generalized anxiety disorder: Secondary | ICD-10-CM

## 2022-02-19 DIAGNOSIS — F4310 Post-traumatic stress disorder, unspecified: Secondary | ICD-10-CM

## 2022-02-20 NOTE — Telephone Encounter (Signed)
I first need to know if the Auvelity BID helped her depression before I know what to do next.  Did it help?  Has she been consistent with the meds?

## 2022-02-21 ENCOUNTER — Other Ambulatory Visit: Payer: Self-pay | Admitting: Psychiatry

## 2022-02-21 MED ORDER — BUPROPION HCL ER (SR) 100 MG PO TB12: 100.0000 mg | ORAL_TABLET | Freq: Two times a day (BID) | ORAL | 1 refills | Status: DC

## 2022-02-21 MED ORDER — DEXTROMETHORPHAN HBR 15 MG PO CAPS: 45.0000 mg | ORAL_CAPSULE | Freq: Two times a day (BID) | ORAL | 0 refills | Status: DC

## 2022-02-21 NOTE — Telephone Encounter (Signed)
Tell her to stop any other forms of Wellbutrin or Auvelity.  She is going to start Wellbutrin SR 100 mg tablet every 12 hours along with dextromethorphan 15 mg capsules, 3 capsules twice daily with the Wellbutrin.  This will duplicate the dosage and the Auvelity and is generic

## 2022-02-21 NOTE — Telephone Encounter (Signed)
Pt stated yes it was helpful for depression and yes she was taking it consistently.She has been without it for 2 weeks.She said her insurance won't cover it.

## 2022-02-21 NOTE — Telephone Encounter (Signed)
LVM to rtc with info 

## 2022-02-22 ENCOUNTER — Ambulatory Visit (INDEPENDENT_AMBULATORY_CARE_PROVIDER_SITE_OTHER): Payer: Commercial Managed Care - PPO | Admitting: Psychology

## 2022-02-22 DIAGNOSIS — F4310 Post-traumatic stress disorder, unspecified: Secondary | ICD-10-CM | POA: Diagnosis not present

## 2022-02-22 DIAGNOSIS — F330 Major depressive disorder, recurrent, mild: Secondary | ICD-10-CM | POA: Diagnosis not present

## 2022-02-22 NOTE — Telephone Encounter (Signed)
LVM with info and to rtc with any questions  

## 2022-02-22 NOTE — Progress Notes (Signed)
[facility] Counselor/Therapist Progress Note  Patient ID: Maria Morse, MRN: [id number],    Date: 02/22/2022  Time Spent: 45 mins  Treatment Type: Individual Therapy  Reported Symptoms: Pt presents for session via webex video.  Pt grants consent for session, stating that she is in her home with no one else present.  I shared with pt that I am in my office with no one else here either.    Mental Status Exam: Appearance:  Casual     Behavior: Appropriate  Motor: Normal  Speech/Language:  Clear and Coherent  Affect: Appropriate  Mood: normal  Thought process: normal  Thought content:   WNL  Sensory/Perceptual disturbances:   WNL  Orientation: oriented to person, place, and time/date  Attention: Good  Concentration: Good  Memory: WNL  Fund of knowledge:  Good  Insight:   Good  Judgment:  Good  Impulse Control: Good   Risk Assessment: Danger to Self:  No Self-injurious Behavior: No Danger to Others: No Duty to Warn:no Physical Aggression / Violence:No  Access to Firearms a concern: No  Gang Involvement:No   Subjective: Pt shares that she has been doing OK since our last session.  Pt shares, "I have been working a lot and the person I was going out with at our last session have now started dating exclusively Thayer Ohm).  He is very thoughtful and seems to want to be in tune with what I am thinking/feeling."  Pt shares that she feels like he is a nice person and that he cares for her well.  Work is going OK and pt is looking forward to being off this weekend.  Pt plans to visit with friends this weekend.  Thayer Ohm has a 24 hrs on/72 hrs off schedule.  Pt shares that the are maintaining a census of about 10 on the [facility] floor and they have gotten more pts on their other units to balance out the overall census.  Pt shares that she has not had another office visit with Dr. Jennelle Human since our last session; she continues to take her medications with no side effects noted.  Pt  shares she continues to sleep well.  Pt shares that her mom is in the process of getting moved into the independent living facility in [place].  "She is teetering between being excited and scared."  Pt shares she hopes to go see her mom for Christmas at the new facility.  Pt shares that pts at work often ask her if she will be at work "tomorrow" because they look forward to seeing her and receiving care from her.  Pt shares that she is "tentatively hopeful" about her relationship with Thayer Ohm; he is aware of her quest for sobriety.  Encouraged pt continue with her self care activities as intentionally as possible and we will meet in 2 wks for a follow up session.  Interventions: Cognitive Behavioral Therapy  Diagnosis:PTSD (post-traumatic stress disorder)  Mild episode of recurrent major depressive disorder (HCC)  Plan: Treatment Plan Strengths/Abilities:  Intelligent, Intuitive, Willing to participate in therapy Treatment Preferences:  Outpatient Individual Therapy Statement of Needs:  Patient is to use CBT, mindfulness and coping skills to help manage and/or decrease symptoms associated with their diagnosis. Symptoms:  Depressed/Irritable mood, worry, social withdrawal Problems Addressed:  Depressive thoughts, Sadness, Sleep issues, etc. Long Term Goals:  Pt to reduce overall level, frequency, and intensity of the feelings of depression/anxiety as evidenced by decreased irritability, negative self talk, and helpless feelings from 6 to 7  days/week to 0 to 1 days/week, per client report, for at least 3 consecutive months.  Progress: 30% Short Term Goals:  Pt to verbally express understanding of the relationship between feelings of depression/anxiety and their impact on thinking patterns and behaviors.  Pt to verbalize an understanding of the role that distorted thinking plays in creating fears, excessive worry, and ruminations.  Progress: 30% Target Date:  10/05/2022 Frequency:  Bi-weekly Modality:   Cognitive Behavioral Therapy Interventions by Therapist:  Therapist will use CBT, Mindfulness exercises, Coping skills and Referrals, as needed by client. Client has verbally approved this treatment plan.  Ivan Anchors, [facility]

## 2022-03-08 ENCOUNTER — Ambulatory Visit: Payer: Commercial Managed Care - PPO | Admitting: Psychology

## 2022-03-09 ENCOUNTER — Other Ambulatory Visit: Payer: Self-pay | Admitting: Psychiatry

## 2022-03-09 DIAGNOSIS — F331 Major depressive disorder, recurrent, moderate: Secondary | ICD-10-CM

## 2022-03-18 ENCOUNTER — Ambulatory Visit (INDEPENDENT_AMBULATORY_CARE_PROVIDER_SITE_OTHER): Payer: Self-pay | Admitting: Psychiatry

## 2022-03-18 DIAGNOSIS — Z91199 Patient's noncompliance with other medical treatment and regimen due to unspecified reason: Secondary | ICD-10-CM

## 2022-03-18 NOTE — Progress Notes (Signed)
No show

## 2022-04-09 ENCOUNTER — Other Ambulatory Visit: Payer: Self-pay | Admitting: Psychiatry

## 2022-04-09 DIAGNOSIS — F331 Major depressive disorder, recurrent, moderate: Secondary | ICD-10-CM

## 2022-04-09 DIAGNOSIS — F410 Panic disorder [episodic paroxysmal anxiety] without agoraphobia: Secondary | ICD-10-CM

## 2022-04-09 DIAGNOSIS — F4310 Post-traumatic stress disorder, unspecified: Secondary | ICD-10-CM

## 2022-04-16 ENCOUNTER — Encounter: Payer: Self-pay | Admitting: Psychiatry

## 2022-04-16 ENCOUNTER — Ambulatory Visit (INDEPENDENT_AMBULATORY_CARE_PROVIDER_SITE_OTHER): Payer: Commercial Managed Care - PPO | Admitting: Psychiatry

## 2022-04-16 DIAGNOSIS — F4310 Post-traumatic stress disorder, unspecified: Secondary | ICD-10-CM | POA: Diagnosis not present

## 2022-04-16 DIAGNOSIS — F410 Panic disorder [episodic paroxysmal anxiety] without agoraphobia: Secondary | ICD-10-CM

## 2022-04-16 DIAGNOSIS — F331 Major depressive disorder, recurrent, moderate: Secondary | ICD-10-CM | POA: Diagnosis not present

## 2022-04-16 DIAGNOSIS — G9332 Myalgic encephalomyelitis/chronic fatigue syndrome: Secondary | ICD-10-CM

## 2022-04-16 DIAGNOSIS — F411 Generalized anxiety disorder: Secondary | ICD-10-CM

## 2022-04-16 DIAGNOSIS — F332 Major depressive disorder, recurrent severe without psychotic features: Secondary | ICD-10-CM

## 2022-04-16 MED ORDER — LITHIUM CARBONATE ER 300 MG PO TBCR: 300.0000 mg | EXTENDED_RELEASE_TABLET | Freq: Every day | ORAL | 0 refills | Status: DC

## 2022-04-16 MED ORDER — ARIPIPRAZOLE 15 MG PO TABS: 7.5000 mg | ORAL_TABLET | Freq: Every day | ORAL | 1 refills | Status: DC

## 2022-04-16 MED ORDER — ESCITALOPRAM OXALATE 20 MG PO TABS: 20.0000 mg | ORAL_TABLET | Freq: Every day | ORAL | 0 refills | Status: DC
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
## 2022-04-17 ENCOUNTER — Ambulatory Visit: Payer: Commercial Managed Care - PPO | Admitting: Psychiatry

## 2022-05-11 ENCOUNTER — Other Ambulatory Visit: Payer: Self-pay | Admitting: Psychiatry

## 2022-05-11 DIAGNOSIS — F410 Panic disorder [episodic paroxysmal anxiety] without agoraphobia: Secondary | ICD-10-CM

## 2022-05-11 DIAGNOSIS — F4310 Post-traumatic stress disorder, unspecified: Secondary | ICD-10-CM

## 2022-05-12 ENCOUNTER — Other Ambulatory Visit: Payer: Self-pay | Admitting: Psychiatry

## 2022-05-12 DIAGNOSIS — F331 Major depressive disorder, recurrent, moderate: Secondary | ICD-10-CM

## 2022-05-13 NOTE — Telephone Encounter (Signed)
Filled 12/5 appt 2/20

## 2022-06-25 ENCOUNTER — Ambulatory Visit: Payer: Commercial Managed Care - PPO | Admitting: Psychiatry

## 2022-07-11 ENCOUNTER — Other Ambulatory Visit: Payer: Self-pay | Admitting: Psychiatry

## 2022-07-11 DIAGNOSIS — F332 Major depressive disorder, recurrent severe without psychotic features: Secondary | ICD-10-CM

## 2022-07-12 ENCOUNTER — Other Ambulatory Visit: Payer: Self-pay | Admitting: Psychiatry

## 2022-07-12 DIAGNOSIS — F331 Major depressive disorder, recurrent, moderate: Secondary | ICD-10-CM

## 2022-07-22 DIAGNOSIS — H60502 Unspecified acute noninfective otitis externa, left ear: Secondary | ICD-10-CM | POA: Diagnosis not present

## 2022-08-19 ENCOUNTER — Ambulatory Visit (INDEPENDENT_AMBULATORY_CARE_PROVIDER_SITE_OTHER): Payer: BC Managed Care – PPO | Admitting: Psychiatry

## 2022-08-19 ENCOUNTER — Encounter: Payer: Self-pay | Admitting: Psychiatry

## 2022-08-19 DIAGNOSIS — G9332 Myalgic encephalomyelitis/chronic fatigue syndrome: Secondary | ICD-10-CM

## 2022-08-19 DIAGNOSIS — F331 Major depressive disorder, recurrent, moderate: Secondary | ICD-10-CM | POA: Diagnosis not present

## 2022-08-19 DIAGNOSIS — F4310 Post-traumatic stress disorder, unspecified: Secondary | ICD-10-CM

## 2022-08-19 DIAGNOSIS — F410 Panic disorder [episodic paroxysmal anxiety] without agoraphobia: Secondary | ICD-10-CM

## 2022-08-19 DIAGNOSIS — F411 Generalized anxiety disorder: Secondary | ICD-10-CM | POA: Diagnosis not present

## 2022-08-19 DIAGNOSIS — F3281 Premenstrual dysphoric disorder: Secondary | ICD-10-CM

## 2022-08-19 MED ORDER — ESCITALOPRAM OXALATE 20 MG PO TABS: 20.0000 mg | ORAL_TABLET | Freq: Every day | ORAL | 0 refills | Status: DC
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
## 2022-08-19 NOTE — Patient Instructions (Signed)
Stop escitalopram, bupropion . Start Trintellix 5 mg daily for 4 days then 10 mg daily. After 2-3  weeks of 10 mg daily if no response increase to 20 mg daily

## 2022-09-16 ENCOUNTER — Telehealth: Payer: Self-pay | Admitting: Psychiatry

## 2022-09-16 MED ORDER — VORTIOXETINE HBR 20 MG PO TABS: 20.0000 mg | ORAL_TABLET | Freq: Every day | ORAL | 1 refills | Status: DC

## 2022-09-16 NOTE — Telephone Encounter (Signed)
Patient taking 20 mg. Rx sent to requested pharmacy. Told her it would likely require a PA. She doesn't have enough samples to last a couple more days and will come pick some up tomorrow. Samples pulled.

## 2022-09-16 NOTE — Telephone Encounter (Signed)
Next appt is 10/21/22. Maria Morse called to say that the Trintellix samples Dr. CC gave her are working very good. Could she please get a prescription for them to be sent to:  [facility], [address] [place], [place] [address]. Phone number is [phone].  Please remove: [id number] - [place], [place] - [address]   Phone: [phone]  Fax: [phone]

## 2022-09-30 DIAGNOSIS — J329 Chronic sinusitis, unspecified: Secondary | ICD-10-CM | POA: Diagnosis not present

## 2022-09-30 DIAGNOSIS — H6692 Otitis media, unspecified, left ear: Secondary | ICD-10-CM | POA: Diagnosis not present

## 2022-10-16 ENCOUNTER — Other Ambulatory Visit: Payer: Self-pay | Admitting: Psychiatry

## 2022-10-16 DIAGNOSIS — F4310 Post-traumatic stress disorder, unspecified: Secondary | ICD-10-CM

## 2022-10-16 DIAGNOSIS — F410 Panic disorder [episodic paroxysmal anxiety] without agoraphobia: Secondary | ICD-10-CM

## 2022-10-21 ENCOUNTER — Ambulatory Visit (INDEPENDENT_AMBULATORY_CARE_PROVIDER_SITE_OTHER): Payer: Self-pay | Admitting: Psychiatry

## 2022-11-01 DIAGNOSIS — H6692 Otitis media, unspecified, left ear: Secondary | ICD-10-CM | POA: Diagnosis not present

## 2022-11-01 DIAGNOSIS — N3941 Urge incontinence: Secondary | ICD-10-CM | POA: Diagnosis not present

## 2022-11-01 DIAGNOSIS — I10 Essential (primary) hypertension: Secondary | ICD-10-CM | POA: Diagnosis not present

## 2022-11-01 DIAGNOSIS — E785 Hyperlipidemia, unspecified: Secondary | ICD-10-CM | POA: Diagnosis not present

## 2022-11-19 ENCOUNTER — Other Ambulatory Visit: Payer: Self-pay | Admitting: Psychiatry

## 2022-11-19 DIAGNOSIS — F410 Panic disorder [episodic paroxysmal anxiety] without agoraphobia: Secondary | ICD-10-CM

## 2022-11-19 DIAGNOSIS — F4310 Post-traumatic stress disorder, unspecified: Secondary | ICD-10-CM

## 2022-12-16 ENCOUNTER — Other Ambulatory Visit: Payer: Self-pay | Admitting: Psychiatry

## 2022-12-16 DIAGNOSIS — F332 Major depressive disorder, recurrent severe without psychotic features: Secondary | ICD-10-CM

## 2022-12-17 NOTE — Telephone Encounter (Signed)
Has appt. tomorrow

## 2022-12-18 ENCOUNTER — Telehealth: Payer: Self-pay

## 2022-12-18 ENCOUNTER — Encounter: Payer: Self-pay | Admitting: Psychiatry

## 2022-12-18 ENCOUNTER — Ambulatory Visit (INDEPENDENT_AMBULATORY_CARE_PROVIDER_SITE_OTHER): Payer: BC Managed Care – PPO | Admitting: Psychiatry

## 2022-12-18 DIAGNOSIS — F331 Major depressive disorder, recurrent, moderate: Secondary | ICD-10-CM

## 2022-12-18 DIAGNOSIS — F3281 Premenstrual dysphoric disorder: Secondary | ICD-10-CM

## 2022-12-18 DIAGNOSIS — F410 Panic disorder [episodic paroxysmal anxiety] without agoraphobia: Secondary | ICD-10-CM | POA: Diagnosis not present

## 2022-12-18 DIAGNOSIS — F411 Generalized anxiety disorder: Secondary | ICD-10-CM | POA: Diagnosis not present

## 2022-12-18 DIAGNOSIS — F4310 Post-traumatic stress disorder, unspecified: Secondary | ICD-10-CM | POA: Diagnosis not present

## 2022-12-18 DIAGNOSIS — G9332 Myalgic encephalomyelitis/chronic fatigue syndrome: Secondary | ICD-10-CM

## 2022-12-18 MED ORDER — DULOXETINE HCL 30 MG PO CPEP
ORAL_CAPSULE | ORAL | 1 refills | Status: DC
Start: 2022-12-18 — End: 2023-01-09

## 2022-12-18 NOTE — Telephone Encounter (Signed)
Prior Authorization initiated with [facility] and approved for Trintellix 20 mg effective 12/18/2023-12/18/2023, PA# [id number]
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
## 2023-01-09 ENCOUNTER — Other Ambulatory Visit: Payer: Self-pay | Admitting: Psychiatry

## 2023-01-09 DIAGNOSIS — F331 Major depressive disorder, recurrent, moderate: Secondary | ICD-10-CM

## 2023-01-09 DIAGNOSIS — F4310 Post-traumatic stress disorder, unspecified: Secondary | ICD-10-CM

## 2023-01-09 DIAGNOSIS — F410 Panic disorder [episodic paroxysmal anxiety] without agoraphobia: Secondary | ICD-10-CM

## 2023-01-09 DIAGNOSIS — F411 Generalized anxiety disorder: Secondary | ICD-10-CM

## 2023-01-09 DIAGNOSIS — G9332 Myalgic encephalomyelitis/chronic fatigue syndrome: Secondary | ICD-10-CM

## 2023-02-19 ENCOUNTER — Ambulatory Visit: Payer: BC Managed Care – PPO | Admitting: Psychiatry

## 2023-02-19 ENCOUNTER — Encounter: Payer: Self-pay | Admitting: Psychiatry

## 2023-02-19 DIAGNOSIS — F411 Generalized anxiety disorder: Secondary | ICD-10-CM

## 2023-02-19 DIAGNOSIS — F410 Panic disorder [episodic paroxysmal anxiety] without agoraphobia: Secondary | ICD-10-CM

## 2023-02-19 DIAGNOSIS — F331 Major depressive disorder, recurrent, moderate: Secondary | ICD-10-CM

## 2023-02-19 DIAGNOSIS — F4310 Post-traumatic stress disorder, unspecified: Secondary | ICD-10-CM

## 2023-02-19 DIAGNOSIS — G9332 Myalgic encephalomyelitis/chronic fatigue syndrome: Secondary | ICD-10-CM

## 2023-02-19 DIAGNOSIS — F3281 Premenstrual dysphoric disorder: Secondary | ICD-10-CM

## 2023-02-19 MED ORDER — DULOXETINE HCL 60 MG PO CPEP
120.0000 mg | ORAL_CAPSULE | Freq: Every day | ORAL | Status: DC
Start: 2023-02-19 — End: 2023-04-14
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
## 2023-04-14 ENCOUNTER — Telehealth: Payer: Self-pay | Admitting: Psychiatry

## 2023-04-14 DIAGNOSIS — F410 Panic disorder [episodic paroxysmal anxiety] without agoraphobia: Secondary | ICD-10-CM

## 2023-04-14 DIAGNOSIS — F4310 Post-traumatic stress disorder, unspecified: Secondary | ICD-10-CM

## 2023-04-14 DIAGNOSIS — F331 Major depressive disorder, recurrent, moderate: Secondary | ICD-10-CM

## 2023-04-14 DIAGNOSIS — F411 Generalized anxiety disorder: Secondary | ICD-10-CM

## 2023-04-14 DIAGNOSIS — G9332 Myalgic encephalomyelitis/chronic fatigue syndrome: Secondary | ICD-10-CM

## 2023-04-14 MED ORDER — DULOXETINE HCL 60 MG PO CPEP
120.0000 mg | ORAL_CAPSULE | Freq: Every day | ORAL | 0 refills | Status: DC
Start: 2023-04-14 — End: 2023-04-22

## 2023-04-14 NOTE — Telephone Encounter (Signed)
Rx sent for 30-day supply of 120 mg duloxetine to [facility] on [address].

## 2023-04-14 NOTE — Telephone Encounter (Addendum)
Maria Morse called at 2:52 to request refill of her Duloxetine.  She was told to call when it was needed.  Appt 12/17. [facility] to [id number] [place], [place] - [address]

## 2023-04-22 ENCOUNTER — Ambulatory Visit: Payer: BC Managed Care – PPO | Admitting: Psychiatry

## 2023-04-22 ENCOUNTER — Encounter: Payer: Self-pay | Admitting: Psychiatry

## 2023-04-22 DIAGNOSIS — F332 Major depressive disorder, recurrent severe without psychotic features: Secondary | ICD-10-CM

## 2023-04-22 DIAGNOSIS — F3281 Premenstrual dysphoric disorder: Secondary | ICD-10-CM

## 2023-04-22 DIAGNOSIS — G9332 Myalgic encephalomyelitis/chronic fatigue syndrome: Secondary | ICD-10-CM

## 2023-04-22 DIAGNOSIS — F410 Panic disorder [episodic paroxysmal anxiety] without agoraphobia: Secondary | ICD-10-CM

## 2023-04-22 DIAGNOSIS — F411 Generalized anxiety disorder: Secondary | ICD-10-CM

## 2023-04-22 DIAGNOSIS — F4310 Post-traumatic stress disorder, unspecified: Secondary | ICD-10-CM

## 2023-04-22 DIAGNOSIS — F331 Major depressive disorder, recurrent, moderate: Secondary | ICD-10-CM | POA: Diagnosis not present

## 2023-04-22 MED ORDER — ARIPIPRAZOLE 15 MG PO TABS: 7.5000 mg | ORAL_TABLET | Freq: Every day | ORAL | 1 refills | Status: DC

## 2023-04-22 MED ORDER — PRAZOSIN HCL 1 MG PO CAPS: ORAL_CAPSULE | ORAL | 0 refills | Status: DC

## 2023-04-22 MED ORDER — LITHIUM CARBONATE ER 300 MG PO TBCR: 300.0000 mg | EXTENDED_RELEASE_TABLET | Freq: Every day | ORAL | 0 refills | Status: DC

## 2023-04-22 MED ORDER — DULOXETINE HCL 60 MG PO CPEP
120.0000 mg | ORAL_CAPSULE | Freq: Every day | ORAL | 0 refills | Status: DC
Start: 2023-04-22 — End: 2023-06-25
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
## 2023-04-23 ENCOUNTER — Ambulatory Visit: Payer: BC Managed Care – PPO | Admitting: Psychiatry

## 2023-05-17 ENCOUNTER — Other Ambulatory Visit: Payer: Self-pay | Admitting: Psychiatry

## 2023-05-17 DIAGNOSIS — F331 Major depressive disorder, recurrent, moderate: Secondary | ICD-10-CM

## 2023-06-11 ENCOUNTER — Ambulatory Visit: Payer: BC Managed Care – PPO | Admitting: Psychiatry

## 2023-06-17 ENCOUNTER — Other Ambulatory Visit: Payer: Self-pay | Admitting: Family Medicine

## 2023-06-17 DIAGNOSIS — Z1231 Encounter for screening mammogram for malignant neoplasm of breast: Secondary | ICD-10-CM

## 2023-06-25 ENCOUNTER — Ambulatory Visit: Payer: BC Managed Care – PPO | Admitting: Psychiatry

## 2023-06-25 ENCOUNTER — Encounter: Payer: Self-pay | Admitting: Psychiatry

## 2023-06-25 ENCOUNTER — Other Ambulatory Visit: Payer: Self-pay | Admitting: Medical Genetics

## 2023-06-25 DIAGNOSIS — F331 Major depressive disorder, recurrent, moderate: Secondary | ICD-10-CM

## 2023-06-25 DIAGNOSIS — F3281 Premenstrual dysphoric disorder: Secondary | ICD-10-CM

## 2023-06-25 DIAGNOSIS — F410 Panic disorder [episodic paroxysmal anxiety] without agoraphobia: Secondary | ICD-10-CM | POA: Diagnosis not present

## 2023-06-25 DIAGNOSIS — F4310 Post-traumatic stress disorder, unspecified: Secondary | ICD-10-CM | POA: Diagnosis not present

## 2023-06-25 DIAGNOSIS — G9332 Myalgic encephalomyelitis/chronic fatigue syndrome: Secondary | ICD-10-CM

## 2023-06-25 DIAGNOSIS — F411 Generalized anxiety disorder: Secondary | ICD-10-CM

## 2023-06-25 DIAGNOSIS — F332 Major depressive disorder, recurrent severe without psychotic features: Secondary | ICD-10-CM

## 2023-06-25 MED ORDER — ARIPIPRAZOLE 15 MG PO TABS: 7.5000 mg | ORAL_TABLET | Freq: Every day | ORAL | 0 refills | Status: DC

## 2023-06-25 MED ORDER — PRAZOSIN HCL 1 MG PO CAPS: ORAL_CAPSULE | ORAL | 0 refills | Status: DC

## 2023-06-25 MED ORDER — LITHIUM CARBONATE ER 300 MG PO TBCR
300.0000 mg | EXTENDED_RELEASE_TABLET | Freq: Every day | ORAL | 0 refills | Status: DC
Start: 2023-06-25 — End: 2023-10-14

## 2023-06-25 MED ORDER — DULOXETINE HCL 60 MG PO CPEP
120.0000 mg | ORAL_CAPSULE | Freq: Every day | ORAL | 0 refills | Status: DC
Start: 2023-06-25 — End: 2023-10-29
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
## 2023-06-30 ENCOUNTER — Telehealth: Payer: Self-pay | Admitting: Psychiatry

## 2023-06-30 ENCOUNTER — Encounter: Payer: Self-pay | Admitting: Psychiatry

## 2023-06-30 ENCOUNTER — Ambulatory Visit (INDEPENDENT_AMBULATORY_CARE_PROVIDER_SITE_OTHER): Payer: BC Managed Care – PPO | Admitting: Psychiatry

## 2023-06-30 DIAGNOSIS — F331 Major depressive disorder, recurrent, moderate: Secondary | ICD-10-CM | POA: Diagnosis not present

## 2023-06-30 NOTE — Telephone Encounter (Signed)
 [name], marek are scheduled for a virtual visit with your provider today.    Just as we do with appointments in the office, we must obtain your consent to participate.  Your consent will be active for this visit and any virtual visit you may have with one of our providers in the next 365 days.    If you have a MyChart account, I can also send a copy of this consent to you electronically.  All virtual visits are billed to your insurance company just like a traditional visit in the office.  As this is a virtual visit, video technology does not allow for your provider to perform a traditional examination.  This may limit your provider's ability to fully assess your condition.  If your provider identifies any concerns that need to be evaluated in person or the need to arrange testing such as labs, EKG, etc, we will make arrangements to do so.    Although advances in technology are sophisticated, we cannot ensure that it will always work on either your end or our end.  If the connection with a video visit is poor, we may have to switch to a telephone visit.  With either a video or telephone visit, we are not always able to ensure that we have a secure connection.   I need to obtain your verbal consent now.   Are you willing to proceed with your visit today?   Maria Morse has provided verbal consent on 06/30/2023 for a virtual visit (video or telephone).   Stevphen Meuse, [facility] 06/30/2023  3:01 PM
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
## 2023-07-01 ENCOUNTER — Ambulatory Visit
Admission: RE | Admit: 2023-07-01 | Discharge: 2023-07-01 | Disposition: A | Discharge status: Home / Self Care | Payer: BC Managed Care – PPO | Source: Ambulatory Visit | Attending: Family Medicine | Admitting: Family Medicine

## 2023-07-01 DIAGNOSIS — Z1231 Encounter for screening mammogram for malignant neoplasm of breast: Secondary | ICD-10-CM

## 2023-07-04 ENCOUNTER — Other Ambulatory Visit: Payer: Self-pay | Admitting: Family Medicine

## 2023-07-04 DIAGNOSIS — R928 Other abnormal and inconclusive findings on diagnostic imaging of breast: Secondary | ICD-10-CM

## 2023-07-09 ENCOUNTER — Ambulatory Visit: Admitting: Psychiatry

## 2023-07-09 DIAGNOSIS — F331 Major depressive disorder, recurrent, moderate: Secondary | ICD-10-CM | POA: Diagnosis not present

## 2023-07-09 NOTE — Progress Notes (Signed)
      [facility] Counselor/Therapist Progress Note  Patient ID: Maria Morse, MRN: [id number],    Date: 07/09/2023  Time Spent: 52 minutes start time 2:58 PM end time 3:50 PM  Treatment Type: Individual Therapy  Reported Symptoms: fatigue, depression, anxiety, focusing issues, excessive sleeping, health issues, low motivation  Mental Status Exam:  Appearance:   Well Groomed     Behavior:  Appropriate  Motor:  Normal  Speech/Language:   Normal Rate  Affect:  Appropriate  Mood:  anxious  Thought process:  normal  Thought content:    WNL  Sensory/Perceptual disturbances:    WNL  Orientation:  oriented to person, place, time/date, and situation  Attention:  Good  Concentration:  Good  Memory:  WNL  Fund of knowledge:   Good  Insight:    Good  Judgment:   Good  Impulse Control:  Good   Risk Assessment: Danger to Self:  No Self-injurious Behavior: No Danger to Others: No Duty to Warn:no Physical Aggression / Violence:No  Access to Firearms a concern: No  Gang Involvement:No   Subjective: Patient was present for session. She shared that she has worked the past 2 days so she has slept more today. She shared she was called back for a diagnostic mammogram that has created anxiety.  Had patient develop treatment plan and set goals in session.  Discussed more grounding exercises and gave patient multiple handouts on coping skills including ST OP P, wise mind, and ACC EPTS.  Patient was encouraged to start practicing the different exercises addressed in session and knows that processing will start at next session.  Interventions: Cognitive Behavioral Therapy and Dialectical Behavioral Therapy  Diagnosis:   ICD-10-CM   1. Major depressive disorder, recurrent episode, moderate (HCC)  F33.1       Plan: Patient is to practice grounding skills, CBT skills and DBT skills discussed to decrease anxiety symptoms.  Patient is to review her handouts regularly.  Patient is to start working  and ways to journal and exercise to release negative emotions appropriately. Patient is also to continue taking medication as directed.   Stevphen Meuse, [facility]

## 2023-07-15 ENCOUNTER — Other Ambulatory Visit: Payer: Self-pay | Admitting: Family Medicine

## 2023-07-15 ENCOUNTER — Ambulatory Visit
Admission: RE | Admit: 2023-07-15 | Discharge: 2023-07-15 | Disposition: A | Discharge status: Home / Self Care | Payer: BC Managed Care – PPO | Source: Ambulatory Visit | Attending: Family Medicine | Admitting: Family Medicine

## 2023-07-15 DIAGNOSIS — N6489 Other specified disorders of breast: Secondary | ICD-10-CM

## 2023-07-15 DIAGNOSIS — R928 Other abnormal and inconclusive findings on diagnostic imaging of breast: Secondary | ICD-10-CM

## 2023-09-04 ENCOUNTER — Other Ambulatory Visit (HOSPITAL_COMMUNITY)
Admission: RE | Admit: 2023-09-04 | Discharge: 2023-09-04 | Disposition: A | Discharge status: Home / Self Care | Payer: Self-pay | Source: Ambulatory Visit | Attending: Medical Genetics | Admitting: Medical Genetics

## 2023-09-04 DIAGNOSIS — N6489 Other specified disorders of breast: Secondary | ICD-10-CM

## 2023-09-14 LAB — GENECONNECT MOLECULAR SCREEN: Genetic Analysis Overall Interpretation: NEGATIVE

## 2023-09-17 ENCOUNTER — Ambulatory Visit: Admitting: Psychiatry

## 2023-09-17 ENCOUNTER — Encounter: Payer: Self-pay | Admitting: Psychiatry

## 2023-09-17 DIAGNOSIS — F331 Major depressive disorder, recurrent, moderate: Secondary | ICD-10-CM

## 2023-09-17 NOTE — Progress Notes (Signed)
      [facility] Counselor/Therapist Progress Note  Patient ID: Maria Morse, MRN: [id number],    Date: 09/17/2023  Time Spent: 58 minutes start time 11:58 AM end time 12:56 PM  Treatment Type: Individual Therapy  Reported Symptoms: anxiety, depression, fatigue, rumination, focusing issues  Mental Status Exam:  Appearance:   Well Groomed     Behavior:  Appropriate  Motor:  Normal  Speech/Language:   Normal Rate  Affect:  Appropriate  Mood:  anxious  Thought process:  normal  Thought content:    WNL  Sensory/Perceptual disturbances:    WNL  Orientation:  oriented to person, place, time/date, and situation  Attention:  Good  Concentration:  Good  Memory:  WNL  Fund of knowledge:   Good  Insight:    Good  Judgment:   Good  Impulse Control:  Good   Risk Assessment: Danger to Self:  No Self-injurious Behavior: No Danger to Others: No Duty to Warn:no Physical Aggression / Violence:No  Access to Firearms a concern: No  Gang Involvement:No   Subjective: Patient was present for session. She shared she is doing better since last session. She shared she has been working extra shifts over the past few weeks which has made it difficult with sleep.  She just got a rescue kitten and she is hoping it goes well because she is getting a friend's dog soon since she won't be able to keep it. Patient stated that the depression is still at moderate even though the medication is helping.  Her cycle is not as bad currently.Her fatigue is hard so she is taking energy drinks once a day to make sure she is awake. She has joined Gene connect through [employer] to screen for some Genetic issues. She shared that she had a bad job situation that she wanted to process in session. She had a boss that made things difficult. She went on to share that she internalized the negative from that boss. Patient shared that moment to work on was she was struggling with COVID fatigue and wasn't as focused as she  needed to be and she set up an unattainable improvement plan. SUDS level  6, negative cognition "I'm not good enough I am a failure" felt anxiety in her chest.  Patient was able to reduce suds level to 3.  Through the processing she was able to recognize that some of her people pleasing behavior made things difficult for her and her jobs.  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  She shared 1 job would have her work 14-hour days and not be compensate her for all of that time. She would get exhausted and had no time for self care. Encouraged patient to take time each day to practice breathing exercises and journal what surfaces between sessions.   Interventions: Eye Movement Desensitization and Reprocessing (EMDR) and Insight-Oriented  Diagnosis:   ICD-10-CM   1. Major depressive disorder, recurrent episode, moderate (HCC)  F33.1       Plan: Patient is to practice grounding skills, CBT skills and DBT skills  to decrease anxiety symptoms.  Patient is to review her handouts regularly.  Patient is to practice breathing exercises, journal ,and exercise to release negative emotions appropriately. Patient is also to continue taking medication as directed.   Marlise Simpers, [facility]

## 2023-10-01 ENCOUNTER — Ambulatory Visit: Admitting: Psychiatry

## 2023-10-01 DIAGNOSIS — F331 Major depressive disorder, recurrent, moderate: Secondary | ICD-10-CM
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
## 2023-10-14 ENCOUNTER — Other Ambulatory Visit: Payer: Self-pay | Admitting: Psychiatry

## 2023-10-14 DIAGNOSIS — F410 Panic disorder [episodic paroxysmal anxiety] without agoraphobia: Secondary | ICD-10-CM

## 2023-10-14 DIAGNOSIS — F4310 Post-traumatic stress disorder, unspecified: Secondary | ICD-10-CM

## 2023-10-14 DIAGNOSIS — F332 Major depressive disorder, recurrent severe without psychotic features: Secondary | ICD-10-CM

## 2023-10-14 DIAGNOSIS — F331 Major depressive disorder, recurrent, moderate: Secondary | ICD-10-CM

## 2023-10-20 ENCOUNTER — Encounter: Payer: Self-pay | Admitting: Psychiatry

## 2023-10-20 ENCOUNTER — Ambulatory Visit: Admitting: Psychiatry

## 2023-10-20 DIAGNOSIS — F331 Major depressive disorder, recurrent, moderate: Secondary | ICD-10-CM | POA: Diagnosis not present
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
## 2023-10-29 ENCOUNTER — Other Ambulatory Visit: Payer: Self-pay | Admitting: Psychiatry

## 2023-10-29 ENCOUNTER — Encounter: Payer: Self-pay | Admitting: Psychiatry

## 2023-10-29 ENCOUNTER — Ambulatory Visit: Payer: BC Managed Care – PPO | Admitting: Psychiatry

## 2023-10-29 ENCOUNTER — Ambulatory Visit: Admitting: Psychiatry

## 2023-10-29 DIAGNOSIS — F4310 Post-traumatic stress disorder, unspecified: Secondary | ICD-10-CM

## 2023-10-29 DIAGNOSIS — F411 Generalized anxiety disorder: Secondary | ICD-10-CM

## 2023-10-29 DIAGNOSIS — F331 Major depressive disorder, recurrent, moderate: Secondary | ICD-10-CM

## 2023-10-29 DIAGNOSIS — F410 Panic disorder [episodic paroxysmal anxiety] without agoraphobia: Secondary | ICD-10-CM | POA: Diagnosis not present

## 2023-10-29 DIAGNOSIS — G9332 Myalgic encephalomyelitis/chronic fatigue syndrome: Secondary | ICD-10-CM

## 2023-10-29 DIAGNOSIS — F330 Major depressive disorder, recurrent, mild: Secondary | ICD-10-CM | POA: Diagnosis not present

## 2023-10-29 DIAGNOSIS — F3281 Premenstrual dysphoric disorder: Secondary | ICD-10-CM

## 2023-10-29 MED ORDER — PRAZOSIN HCL 1 MG PO CAPS: ORAL_CAPSULE | ORAL | 0 refills | Status: DC

## 2023-10-29 MED ORDER — DULOXETINE HCL 60 MG PO CPEP
ORAL_CAPSULE | ORAL | 0 refills | Status: DC
Start: 2023-10-29 — End: 2023-11-14
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
## 2023-11-04 ENCOUNTER — Telehealth: Payer: Self-pay | Admitting: Psychiatry

## 2023-11-04 NOTE — Telephone Encounter (Signed)
 Pt states she got a msg from pharmacy about alternative for Cymbalta  but wants to stay on it and wants to know what Dr. Geoffry could advise. She is taking generic and wonders why she can't keep taking it. Sounds like a ins issue.

## 2023-11-04 NOTE — Telephone Encounter (Addendum)
 Pt is on duloxetine  60 - 3 capsules a day. Called pharmacy and they said insurance will only pay for 2 a day. No PA in CMM.   She has been on medication long term, but this dose is new; increased from 120 mg on 6/25.   Notified patient of situation. She said if they deny it she will pay for one capsule OOP.

## 2023-11-12 ENCOUNTER — Telehealth: Payer: Self-pay

## 2023-11-12 ENCOUNTER — Telehealth: Payer: Self-pay | Admitting: Psychiatry

## 2023-11-12 ENCOUNTER — Ambulatory Visit (INDEPENDENT_AMBULATORY_CARE_PROVIDER_SITE_OTHER): Admitting: Psychiatry

## 2023-11-12 DIAGNOSIS — F331 Major depressive disorder, recurrent, moderate: Secondary | ICD-10-CM

## 2023-11-12 NOTE — Telephone Encounter (Signed)
 No clear on no PA in [facility]. She was under the impression you were submitting a PA for the Cymbalta  so she could get 3 a day. Pt would like a CB to discuss.

## 2023-11-12 NOTE — Telephone Encounter (Signed)
 Traci will do the PA. CMM is Cover My Meds. I have sent her a message.

## 2023-11-12 NOTE — Telephone Encounter (Signed)
 Maria Morse is here seeing HI and wanted to know if her Cymbalta  has been approved by her insurance company?

## 2023-11-12 NOTE — Telephone Encounter (Signed)
 There is nothing in CMM. Called pharmacy and was told they will only cover 2 a day and she is prescribed 3.
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
## 2023-11-13 NOTE — Telephone Encounter (Signed)
 There have been multiple messages about this same issue with her Duloxetine  which I did document but will do it again in this message as well. PA was denied for 3 capsules daily Duloxetine  60 mg, [facility] require the following be met for approval for the indication being requested, the higher dose, how often given and quantity be supported by the following: The drug labeling from the manufacturer The following medical resources: The [facility] Formulary Service Drug Information. DRUGDEX system by [employer] Two articles from major peer-reviewed medical journals.  Dr. Geoffry did note her using [facility] for one of the capsules since they only cover for 2/day

## 2023-11-13 NOTE — Telephone Encounter (Signed)
 PA pending with [facility] due to quantity

## 2023-11-13 NOTE — Telephone Encounter (Signed)
 Suppose to go to Dr. Geoffry not Angeline

## 2023-11-13 NOTE — Telephone Encounter (Signed)
 See other phone message

## 2023-11-13 NOTE — Telephone Encounter (Signed)
 I can try to submit a PA but just a reminder FDA approved for maximum of 120 mg daily of Cymbalta .

## 2023-11-14 ENCOUNTER — Other Ambulatory Visit: Payer: Self-pay

## 2023-11-14 DIAGNOSIS — F4310 Post-traumatic stress disorder, unspecified: Secondary | ICD-10-CM

## 2023-11-14 DIAGNOSIS — F411 Generalized anxiety disorder: Secondary | ICD-10-CM

## 2023-11-14 DIAGNOSIS — F331 Major depressive disorder, recurrent, moderate: Secondary | ICD-10-CM

## 2023-11-14 DIAGNOSIS — F410 Panic disorder [episodic paroxysmal anxiety] without agoraphobia: Secondary | ICD-10-CM

## 2023-11-14 DIAGNOSIS — G9332 Myalgic encephalomyelitis/chronic fatigue syndrome: Secondary | ICD-10-CM

## 2023-11-14 MED ORDER — DULOXETINE HCL 60 MG PO CPEP: 60.0000 mg | ORAL_CAPSULE | Freq: Every evening | ORAL | 1 refills | Status: DC

## 2023-11-14 MED ORDER — DULOXETINE HCL 60 MG PO CPEP: ORAL_CAPSULE | ORAL | 0 refills | Status: DC

## 2023-12-01 ENCOUNTER — Ambulatory Visit: Admitting: Psychiatry

## 2023-12-01 DIAGNOSIS — F330 Major depressive disorder, recurrent, mild: Secondary | ICD-10-CM
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
## 2023-12-10 ENCOUNTER — Ambulatory Visit (INDEPENDENT_AMBULATORY_CARE_PROVIDER_SITE_OTHER): Admitting: Psychiatry

## 2023-12-10 DIAGNOSIS — F330 Major depressive disorder, recurrent, mild: Secondary | ICD-10-CM
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
## 2023-12-29 ENCOUNTER — Ambulatory Visit (INDEPENDENT_AMBULATORY_CARE_PROVIDER_SITE_OTHER): Admitting: Psychiatry

## 2023-12-29 DIAGNOSIS — F330 Major depressive disorder, recurrent, mild: Secondary | ICD-10-CM
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
## 2024-01-07 ENCOUNTER — Encounter: Payer: Self-pay | Admitting: Psychiatry

## 2024-01-07 ENCOUNTER — Ambulatory Visit: Admitting: Psychiatry

## 2024-01-07 DIAGNOSIS — F331 Major depressive disorder, recurrent, moderate: Secondary | ICD-10-CM | POA: Diagnosis not present

## 2024-01-07 DIAGNOSIS — F4310 Post-traumatic stress disorder, unspecified: Secondary | ICD-10-CM

## 2024-01-07 DIAGNOSIS — G9332 Myalgic encephalomyelitis/chronic fatigue syndrome: Secondary | ICD-10-CM

## 2024-01-07 DIAGNOSIS — F3281 Premenstrual dysphoric disorder: Secondary | ICD-10-CM

## 2024-01-07 DIAGNOSIS — F410 Panic disorder [episodic paroxysmal anxiety] without agoraphobia: Secondary | ICD-10-CM | POA: Diagnosis not present

## 2024-01-07 DIAGNOSIS — F411 Generalized anxiety disorder: Secondary | ICD-10-CM | POA: Diagnosis not present

## 2024-01-07 MED ORDER — DULOXETINE HCL 60 MG PO CPEP: ORAL_CAPSULE | ORAL | 1 refills | Status: DC

## 2024-01-07 MED ORDER — DULOXETINE HCL 60 MG PO CPEP: 60.0000 mg | ORAL_CAPSULE | Freq: Every evening | ORAL | 1 refills | Status: DC

## 2024-01-07 MED ORDER — PRAZOSIN HCL 1 MG PO CAPS: ORAL_CAPSULE | ORAL | 0 refills | Status: AC
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
## 2024-01-08 ENCOUNTER — Ambulatory Visit (INDEPENDENT_AMBULATORY_CARE_PROVIDER_SITE_OTHER): Admitting: Psychiatry

## 2024-01-08 DIAGNOSIS — F330 Major depressive disorder, recurrent, mild: Secondary | ICD-10-CM | POA: Diagnosis not present
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
## 2024-01-15 ENCOUNTER — Other Ambulatory Visit: Payer: Self-pay | Admitting: Psychiatry

## 2024-01-15 DIAGNOSIS — F331 Major depressive disorder, recurrent, moderate: Secondary | ICD-10-CM

## 2024-01-16 ENCOUNTER — Encounter

## 2024-01-16 ENCOUNTER — Other Ambulatory Visit: Payer: Self-pay | Admitting: Psychiatry

## 2024-01-16 ENCOUNTER — Other Ambulatory Visit

## 2024-01-16 DIAGNOSIS — F332 Major depressive disorder, recurrent severe without psychotic features: Secondary | ICD-10-CM

## 2024-01-21 ENCOUNTER — Ambulatory Visit: Admitting: Psychiatry

## 2024-01-21 DIAGNOSIS — F330 Major depressive disorder, recurrent, mild: Secondary | ICD-10-CM | POA: Diagnosis not present

## 2024-01-21 NOTE — Progress Notes (Unsigned)
      [facility] Counselor/Therapist Progress Note  Patient ID: Maria Morse, MRN: [id number],    Date: 01/21/2024  Time Spent: 52 minutes start time 2:07 PM end time 2:59 PM  Treatment Type: Individual Therapy  Reported Symptoms: anxiety, sadness, triggered response, sleep issues, rumination  Mental Status Exam:  Appearance:   Casual     Behavior:  Appropriate  Motor:  Normal  Speech/Language:   Normal Rate  Affect:  Appropriate  Mood:  anxious  Thought process:  normal  Thought content:    WNL  Sensory/Perceptual disturbances:    WNL  Orientation:  oriented to person, place, time/date, and situation  Attention:  Good  Concentration:  Good  Memory:  WNL  Fund of knowledge:   Good  Insight:    Good  Judgment:   Good  Impulse Control:  Good   Risk Assessment: Danger to Self:  No Self-injurious Behavior: No Danger to Others: No Duty to Warn:no Physical Aggression / Violence:No  Access to Firearms a concern: No  Gang Involvement:No   Subjective: Patient was present for session.  She shared she is having anxiety over everything going on in the country.  Had patient shared more of the details that were concerning for her.  She went on to share the most troubling thing for her was Bebe Mulders using Christianity to be decisive, 7, negative cognition I don't know who I am felt anxiety anger, frustration in her throat and chest.  She was able to reduced SUDS to 3 and was able realize that there was things that she can do to make a difference.  Encouraged her to focus on those things.  Interventions: Eye Movement Desensitization and Reprocessing (EMDR) and Insight-Oriented  Diagnosis:   ICD-10-CM   1. Mild episode of recurrent major depressive disorder (HCC)  F33.0       Plan:  Patient is to practice grounding skills, CBT skills and DBT skills to decrease anxiety symptoms. Patient is to work on making sure she drinks 40 ounces of water daily. Patient is to increase time  when she walks her dogs in the evenings that she works in in the morning and evenings on days she does not work.  She is to try and focus on eating foods that are good for her brain and body.  Patient is also to make a list of her successes and read them when she starts feeling defeated. Patient is to review her handouts regularly. Patient is to practice breathing exercises, journal ,and exercise to release negative emotions appropriately. Patient is also to continue taking medication as directed.     Silvano Pacini, [facility]

## 2024-02-10 ENCOUNTER — Ambulatory Visit (INDEPENDENT_AMBULATORY_CARE_PROVIDER_SITE_OTHER): Admitting: Psychiatry

## 2024-02-10 DIAGNOSIS — F330 Major depressive disorder, recurrent, mild: Secondary | ICD-10-CM
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
## 2024-02-23 ENCOUNTER — Ambulatory Visit (INDEPENDENT_AMBULATORY_CARE_PROVIDER_SITE_OTHER): Admitting: Psychiatry

## 2024-02-23 DIAGNOSIS — F330 Major depressive disorder, recurrent, mild: Secondary | ICD-10-CM

## 2024-02-23 NOTE — Progress Notes (Signed)
 [facility] Counselor/Therapist Progress Note  Patient ID: Maria Morse, MRN: [id number],    Date: 02/23/2024  Time Spent: 52 minutes start time 3:02 PM end time 3:54 PM Virtual Visit via Video Note Connected with patient by a telemedicine/telehealth application, with their informed consent, and verified patient privacy and that I am speaking with the correct person using two identifiers. I discussed the limitations, risks, security and privacy concerns of performing psychotherapy and the availability of in person appointments. I also discussed with the patient that there may be a patient responsible charge related to this service. The patient expressed understanding and agreed to proceed. I discussed the treatment planning with the patient. The patient was provided an opportunity to ask questions and all were answered. The patient agreed with the plan and demonstrated an understanding of the instructions. The patient was advised to call  our office if  symptoms worsen or feel they are in a crisis state and need immediate contact.   Therapist Location: home Patient Location: home    Treatment Type: Individual Therapy  Reported Symptoms: anxiety, sadness, triggered responses, rumination, fatigue, low motivation  Mental Status Exam:  Appearance:   Casual     Behavior:  Appropriate  Motor:  Normal  Speech/Language:   Normal Rate  Affect:  Constricted  Mood:  labile  Thought process:  circumstantial  Thought content:    WNL  Sensory/Perceptual disturbances:    WNL  Orientation:  oriented to person, place, time/date, and situation  Attention:  Good  Concentration:  Good  Memory:  WNL  Fund of knowledge:   Fair  Insight:    Good  Judgment:   Good  Impulse Control:  Good   Risk Assessment: Danger to Self:  No Self-injurious Behavior: No Danger to Others: No Duty to Warn:no Physical Aggression / Violence:No  Access to Firearms a concern: No  Gang Involvement:No    Subjective: Met with patient via virtual session. She shared that she had a hard weekend due to working and all of the protests going on in the world. She shared she had a shut down for a weekend because she couldn't get out of bed. She explained it feels like it was caused by the state of the world. She shared she was not sure if going to her college reunion triggered it as well. She shared being up [place] where she had good memories and than coming back here to her life.  Patient was able to discuss the different steps in her journey as to how she got to the place she is in currently.  Discussed how the weekend of being in bed was probably triggered by a feeling of hopelessness and powerlessness with her situation and the state of the world.  From that used that information to help her see that she may need to make some changes in her life.  Patient acknowledged that she is wanting to return to school or get into a different career path.  Discussed different ideas and things that she can look into for herself.  Also encouraged her to look into the different activist organizations like [place] [employer] to see if she can find a place where she can connect with people who feel more likeminded and are trying to make a difference in the community.  Interventions: Solution-Oriented/Positive Psychology and Insight-Oriented  Diagnosis:   ICD-10-CM   1. Mild episode of recurrent major depressive disorder  F33.0       Plan:  Patient is to practice grounding skills, CBT skills and DBT skills to decrease anxiety symptoms.  Patient is to look into different school options or work options.  Patient is also to look into potential activist groups that she could join.  Patient is to work on making sure she drinks 40 ounces of water daily. Patient is to increase time when she walks her dogs in the evenings that she works in in the morning and evenings on days she does not work. She is to try and focus on eating foods  that are good for her brain and body. Patient is also to make a list of her successes and read them when she starts feeling defeated. Patient is to review her handouts regularly. Patient is to practice breathing exercises, journal ,and exercise to release negative emotions appropriately. Patient is also to continue taking medication as directed.     Silvano Pacini, [facility]

## 2024-03-04 ENCOUNTER — Ambulatory Visit: Admitting: Psychiatry

## 2024-03-09 ENCOUNTER — Telehealth: Payer: Self-pay | Admitting: Psychiatry

## 2024-03-09 ENCOUNTER — Ambulatory Visit: Admitting: Psychiatry

## 2024-03-09 DIAGNOSIS — F331 Major depressive disorder, recurrent, moderate: Secondary | ICD-10-CM

## 2024-03-09 NOTE — Progress Notes (Signed)
 [facility] Counselor/Therapist Progress Note  Patient ID: Maria Morse, MRN: [id number],    Date: 03/09/2024  Time Spent: 54 minutes start time 2:00 PM end time 2:54 PM Virtual Visit via Video Note Connected with patient by a telemedicine/telehealth application, with their informed consent, and verified patient privacy and that I am speaking with the correct person using two identifiers. I discussed the limitations, risks, security and privacy concerns of performing psychotherapy and the availability of in person appointments. I also discussed with the patient that there may be a patient responsible charge related to this service. The patient expressed understanding and agreed to proceed. I discussed the treatment planning with the patient. The patient was provided an opportunity to ask questions and all were answered. The patient agreed with the plan and demonstrated an understanding of the instructions. The patient was advised to call  our office if  symptoms worsen or feel they are in a crisis state and need immediate contact.   Therapist Location: home Patient Location: home    Treatment Type: Individual Therapy  Reported Symptoms: sadness, fatigue, anxiety, triggered responses,, rumination, passive thoughts of death, melt down  Mental Status Exam:  Appearance:   Casual     Behavior:  Appropriate  Motor:  Normal  Speech/Language:   Normal Rate  Affect:  Appropriate  Mood:  sad  Thought process:  normal  Thought content:    WNL  Sensory/Perceptual disturbances:    WNL  Orientation:  oriented to person, place, time/date, and situation  Attention:  Good  Concentration:  Good  Memory:  WNL  Fund of knowledge:   Good  Insight:    Good  Judgment:   Good  Impulse Control:  Good   Risk Assessment: Danger to Self:  No Self-injurious Behavior: No Danger to Others: No Duty to Warn:no Physical Aggression / Violence:No  Access to Firearms a concern: No  Gang Involvement:No    Subjective: Met with patient via virtual session. She shared she had a melt down at work. She shared she was able to talk to her brother and he helped calm her down. She had a date the night before and that did not go well. She shared she is not happy where she is in life and it is all causing her fatigue.  She shared she was concerned she may need a higher level of care.  Discussed the possibility of her going to [facility] if she felt that that were true.  She shared at this time she feels she is okay but agreed to contact the facility if things were to change.  Patient was encouraged to think through what things that she could do immediately to try and make things better in her life.  Patient was encouraged to think of the smallest change she could make to give herself hope.  She was able to share that making sure she showered either of the evening before or the day she goes to work would be helpful.  Patient stated that she has gotten to a point where she is not showering daily and sometimes only once a week.  Patient was also encouraged to think about just getting outside and sitting in the sun for a few minutes each day so that she acknowledged she has some seasonal depression issues.  Patient was also encouraged to define 5 things each day that she can be thankful for.  She shared she is in a gratitude group home on but typically does  not participate.  Encouraged her to go ahead and put her 5 things out for the group every day since she does check in with them anyways.  Also discussed the importance of getting back involved in AA or potentially OA so she can start working her steps and having more contact with others.  Patient did share that she is realizing that she does not have control over her depression and has to connect with her higher power which is a positive step for her as well.  Patient reported feeling good about plans from session and that things discussed could help her move in a more  positive direction.  She also admitted she has not been following plans from previous sessions.  Interventions: Solution-Oriented/Positive Psychology and Insight-Oriented  Diagnosis:   ICD-10-CM   1. Major depressive disorder, recurrent episode, moderate (HCC)  F33.1       Plan: Patient is to practice grounding skills, CBT skills and DBT skills to decrease anxiety symptoms.  Patient is to work on showering before she goes to work either evening or the day of.  Patient is to work on 5 things she is thankful for with her gratitude group daily.  Patient is to work out on getting out in the sun for a few minutes each day.  Patient is to work on making sure she drinks 40 ounces of water daily. Patient is to increase time when she walks her dogs in the evenings that she works in in the morning and evenings on days she does not work. She is to try and focus on eating foods that are good for her brain and body. Patient is also to make a list of her successes and read them when she starts feeling defeated. Patient is to review her handouts regularly. Patient is to practice breathing exercises, journal ,and exerci   Silvano Pacini, LCMHC

## 2024-03-10 NOTE — Telephone Encounter (Signed)
 Made in error

## 2024-03-17 ENCOUNTER — Ambulatory Visit: Admitting: Psychiatry

## 2024-03-17 DIAGNOSIS — F331 Major depressive disorder, recurrent, moderate: Secondary | ICD-10-CM
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
## 2024-03-19 ENCOUNTER — Telehealth: Payer: Self-pay | Admitting: Psychiatry

## 2024-03-19 NOTE — Telephone Encounter (Signed)
 Returned patient's call and she shared that she is having lots of thoughts concerning an IOP program. Encouraged her to contact [facility] to discuss options for her. Agreed to touch base on Monday.

## 2024-03-19 NOTE — Telephone Encounter (Signed)
 Katie called stating that she has an update on some information from last visit. She didn't go into detail just wanted a return call. I did explain to her you were out of office and may not be able to get back to her until Monday. PH: [phone]

## 2024-04-19 ENCOUNTER — Ambulatory Visit: Admitting: Psychiatry

## 2024-04-20 ENCOUNTER — Ambulatory Visit: Admitting: Psychiatry

## 2024-04-20 DIAGNOSIS — F330 Major depressive disorder, recurrent, mild: Secondary | ICD-10-CM

## 2024-04-20 NOTE — Progress Notes (Signed)
 [facility] Counselor/Therapist Progress Note  Patient ID: Maria Morse, MRN: [id number],    Date: 04/20/2024  Time Spent: 50 minutes start time 11:00 AM end time 11:50 AM Virtual Visit via Video Note Connected with patient by a telemedicine/telehealth application, with their informed consent, and verified patient privacy and that I am speaking with the correct person using two identifiers. I discussed the limitations, risks, security and privacy concerns of performing psychotherapy and the availability of in person appointments. I also discussed with the patient that there may be a patient responsible charge related to this service. The patient expressed understanding and agreed to proceed. I discussed the treatment planning with the patient. The patient was provided an opportunity to ask questions and all were answered. The patient agreed with the plan and demonstrated an understanding of the instructions. The patient was advised to call  our office if  symptoms worsen or feel they are in a crisis state and need immediate contact.   Therapist Location: home Patient Location: home    Treatment Type: Individual Therapy  Reported Symptoms: sadness, anxiety, triggered responses, low motivation, sleep issues, fatigue  Mental Status Exam:  Appearance:   Casual     Behavior:  Appropriate  Motor:  Normal  Speech/Language:   Normal Rate  Affect:  Appropriate  Mood:  tired  Thought process:  normal  Thought content:    WNL  Sensory/Perceptual disturbances:    WNL  Orientation:  oriented to person, place, time/date, and situation  Attention:  Good  Concentration:  Good  Memory:  WNL  Fund of knowledge:   Good  Insight:    Good  Judgment:   Good  Impulse Control:  Good   Risk Assessment: Danger to Self:  No Self-injurious Behavior: No Danger to Others: No Duty to Warn:no Physical Aggression / Violence:No  Access to Firearms a concern: No  Gang Involvement:No   Subjective:  Met with patient via virtual session. She shared that she did not go into the program because it did not work out for her. She went on to share she also has started seeing someone and it has helped her greatly. She shared there have been ups and downs but she feels she can fully be herself around the person. She is concerned because he does drink and was in [place] but it isn't for him. She knows that it something for her to be aware of as they date.  Discussed how it is nice to be with someone who accepts her for her. She is concerned that she may have some sleep apnea discussed importance getting a sleep study if that is a possibility.  Patient stated she wanted to think through progress and how she was doing in treatment in session.  Reviewed treatment plans and updated goals in session.  Through the process it became apparent that the idea of cognitive distortions need to be readdressed in session. Discussed cognitive distortions that she often has.  She was able to see that she is telling herself that My life will always be like this, I am going to die alone, I am not worthy of love or loving myself I'm not worth investing in discussed each of the automatic negative thoughts and identified the distorted part of each of the thoughts.  From there reframed them and helped her see what the facts/truths were.  Patient was able to acknowledge that they happen very quickly and they are often tied to trauma from the past.  Discussed the importance of recognizing when she has a cognitive distortion and reframing it.  Agreed to give patient a handout on it next time she is in session.  Discussed ways to affirm herself regularly and have things move in a more positive direction.  Interventions: Cognitive Behavioral Therapy and Insight-Oriented  Diagnosis:   ICD-10-CM   1. 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  Patient is to try to identify cognitive distortions and start journaling them and reframing.  Patient is to work on 5 things she is thankful for with her gratitude group daily.  Patient is to work out on getting out in the sun for a few minutes each day.  Patient is to work on making sure she drinks 40 ounces of water daily. Patient is to increase time when she walks her dogs in the evenings that she works in in the morning and evenings on days she does not work. She is to try and focus on eating foods that are good for her brain and body. Patient is also to make a list of her successes and read them when she starts feeling defeated. Patient is to review her handouts regularly. Patient is to practice breathing exercises, journaling.    Silvano Pacini, [facility]

## 2024-05-17 ENCOUNTER — Ambulatory Visit: Admitting: Psychiatry

## 2024-05-17 ENCOUNTER — Encounter: Payer: Self-pay | Admitting: Psychiatry

## 2024-05-17 ENCOUNTER — Ambulatory Visit (INDEPENDENT_AMBULATORY_CARE_PROVIDER_SITE_OTHER): Admitting: Psychiatry

## 2024-05-17 DIAGNOSIS — F410 Panic disorder [episodic paroxysmal anxiety] without agoraphobia: Secondary | ICD-10-CM

## 2024-05-17 DIAGNOSIS — F331 Major depressive disorder, recurrent, moderate: Secondary | ICD-10-CM

## 2024-05-17 DIAGNOSIS — F411 Generalized anxiety disorder: Secondary | ICD-10-CM

## 2024-05-17 DIAGNOSIS — F3281 Premenstrual dysphoric disorder: Secondary | ICD-10-CM

## 2024-05-17 DIAGNOSIS — G9332 Myalgic encephalomyelitis/chronic fatigue syndrome: Secondary | ICD-10-CM | POA: Diagnosis not present

## 2024-05-17 DIAGNOSIS — F4310 Post-traumatic stress disorder, unspecified: Secondary | ICD-10-CM | POA: Diagnosis not present

## 2024-05-17 MED ORDER — DULOXETINE HCL 60 MG PO CPEP: ORAL_CAPSULE | ORAL | 1 refills | Status: AC

## 2024-05-17 MED ORDER — BUPROPION HCL ER (XL) 150 MG PO TB24: ORAL_TABLET | ORAL | 0 refills | Status: AC

## 2024-05-17 MED ORDER — DULOXETINE HCL 60 MG PO CPEP: 60.0000 mg | ORAL_CAPSULE | Freq: Every evening | ORAL | 1 refills | Status: AC

## 2024-05-17 NOTE — Progress Notes (Unsigned)
 "       [facility] Counselor/Therapist Progress Note  Patient ID: Maria Morse, MRN: [id number],    Date: 05/17/2024  Time Spent: 52 minutes start time 4:00 PM end time 4:52 PM Virtual Visit via Video Note Connected with patient by a telemedicine/telehealth application, with their informed consent, and verified patient privacy and that I am speaking with the correct person using two identifiers. I discussed the limitations, risks, security and privacy concerns of performing psychotherapy and the availability of in person appointments. I also discussed with the patient that there may be a patient responsible charge related to this service. The patient expressed understanding and agreed to proceed. I discussed the treatment planning with the patient. The patient was provided an opportunity to ask questions and all were answered. The patient agreed with the plan and demonstrated an understanding of the instructions. The patient was advised to call  our office if  symptoms worsen or feel they are in a crisis state and need immediate contact.   Therapist Location: home Patient Location: home    Treatment Type: Individual Therapy  Reported Symptoms: triggered responses, anxiety, low motivation, fatigue, sadness, rumination, sleep issues  Mental Status Exam:  Appearance:   Neat     Behavior:  Appropriate  Motor:  Normal  Speech/Language:   Normal Rate  Affect:  Appropriate  Mood:  anxious and sad  Thought process:  circumstantial  Thought content:    WNL  Sensory/Perceptual disturbances:    WNL  Orientation:  oriented to person, place, time/date, and situation  Attention:  Good  Concentration:  Good  Memory:  WNL  Fund of knowledge:   Good  Insight:    Good  Judgment:   Good  Impulse Control:  Good   Risk Assessment: Danger to Self:  No Self-injurious Behavior: No Danger to Others: No Duty to Warn:no Physical Aggression / Violence:No  Access to Firearms a concern: No  Gang  Involvement:No   Subjective: Met with patient via virtual session. She shared her mood has been okay. She did get to see her mother before Christmas. She went on to share that there have been some bumps in the relationship and she is not sure if it is trauma related. She shared she is finding if they are not together she gets more anxious and they start to fight. She shared it seems she is doing the best to push her away. She shared last week they were talking and she asked questions that were not appropriate. She went on to share she could recognize her pattern in the past was I will hurt you before you hurt me. SUDS level 7, negative cognition I don't deserve Love felt sadness anger in her chest and throat.  Patient was able to reduce suds level to 4.  She was able to recognize that she does deserve to be treated differently and that even though she cannot control other people she can work on changing the pattern of interactions.  Patient is to allow processing to continue over the next week.  Interventions: Eye Movement Desensitization and Reprocessing (EMDR) and Insight-Oriented  Diagnosis:   ICD-10-CM   1. Major depressive disorder, recurrent episode, moderate (HCC)  F33.1       Plan:  Patient is to practice grounding skills, CBT skills and DBT skills to decrease anxiety symptoms.  Patient is to try to identify cognitive distortions and start journaling them and reframing.  Patient is to work on 5 things she is thankful for  with her gratitude group daily.  Patient is to work out on getting out in the sun for a few minutes each day.  Patient is to work on making sure she drinks 40 ounces of water daily. Patient is to increase time when she walks her dogs in the evenings that she works in in the morning and evenings on days she does not work. She is to try and focus on eating foods that are good for her brain and body. Patient is also to make a list of her successes and read them when she starts  feeling defeated. Patient is to review her handouts regularly. Patient is to practice breathing exercises, journaling  Silvano Pacini, [facility]                   "
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
## 2024-05-27 ENCOUNTER — Ambulatory Visit: Admitting: Psychiatry

## 2024-05-27 DIAGNOSIS — F331 Major depressive disorder, recurrent, moderate: Secondary | ICD-10-CM | POA: Diagnosis not present

## 2024-05-27 NOTE — Progress Notes (Unsigned)
"   °      [facility] Counselor/Therapist Progress Note  Patient ID: Maria Morse, MRN: [id number],    Date: 05/27/2024  Time Spent: 54 minutes start time 3:12 PM end time 4:06  Treatment Type: Individual Therapy  Reported Symptoms: depression, anxiety, triggered responses, rumination, fatigue  Mental Status Exam:  Appearance:   Casual     Behavior:  Appropriate  Motor:  Normal  Speech/Language:   Normal Rate  Affect:  Appropriate  Mood:  anxious  Thought process:  normal  Thought content:    WNL  Sensory/Perceptual disturbances:    WNL  Orientation:  oriented to person, place, time/date, and situation  Attention:  Good  Concentration:  Good  Memory:  WNL  Fund of knowledge:   Good  Insight:    Good  Judgment:   Good  Impulse Control:  Good   Risk Assessment: Danger to Self:  No Self-injurious Behavior: No Danger to Others: No Duty to Warn:no Physical Aggression / Violence:No  Access to Firearms a concern: No  Gang Involvement:No   Subjective: Patient was present for session. She shared she is starting her new medication and monitoring how she feels. She shared that she did break off the relationship due to his critical behavior. Encouraged her to feel good about being able to do that. She shared she is starting to accept different parts of her identity. Discussed how that is a positive thing for her. She was able to recognize not feeling heard is a huge issue for her. Had her think through the 1st time she could recall feeling that way. She shared she spent a lot of time on her own as a child while her parents were working to get her siblings through high school and college. Did processing set on the shouting with parents and sister, SUDS level 6, negative cognition I have to be perfect, felt sadness fear in her chest. Patient was able to reduce SUDS level to 4. Had her connect with the younger part of her and realize she is not alone. Encouraged patient to remind her of that  over the next week.  Interventions: Eye Movement Desensitization and Reprocessing (EMDR) and Insight-Oriented  Diagnosis:   ICD-10-CM   1. Major depressive disorder, recurrent episode, moderate (HCC)  F33.1       Plan:  Patient is to practice grounding skills, CBT skills and DBT skills to decrease anxiety symptoms.  Patient is to try to identify cognitive distortions and start journaling them and reframing.  Patient is to work on 5 things she is thankful for with her gratitude group daily.  Patient is to work out on getting out in the sun for a few minutes each day.  Patient is to work on making sure she drinks 40 ounces of water daily. Patient is to increase time when she walks her dogs in the evenings that she works in in the morning and evenings on days she does not work. She is to try and focus on eating foods that are good for her brain and body. Patient is also to make a list of her successes and read them when she starts feeling defeated. Patient is to review her handouts regularly. Patient is to practice breathing exercises, journaling   Silvano Pacini, [facility]                   "

## 2024-06-07 ENCOUNTER — Other Ambulatory Visit: Payer: Self-pay | Admitting: Psychiatry

## 2024-06-07 MED ORDER — BUPROPION HCL ER (XL) 300 MG PO TB24: 300.0000 mg | ORAL_TABLET | Freq: Every day | ORAL | 1 refills | Status: AC

## 2024-06-15 ENCOUNTER — Ambulatory Visit: Admitting: Psychiatry

## 2024-06-29 ENCOUNTER — Ambulatory Visit: Admitting: Psychiatry

## 2024-07-12 ENCOUNTER — Ambulatory Visit: Admitting: Psychiatry

## 2024-07-13 ENCOUNTER — Ambulatory Visit: Admitting: Psychiatry

## 2024-07-21 ENCOUNTER — Ambulatory Visit: Admitting: Psychiatry

## 2024-08-05 ENCOUNTER — Ambulatory Visit: Admitting: Psychiatry

## 2024-08-19 ENCOUNTER — Ambulatory Visit: Admitting: Psychiatry
